# Patient Record
Sex: Female | Born: 1950 | Race: White | Hispanic: No | Smoking: Never smoker
Health system: Southern US, Community
[De-identification: ages and names within clinical notes are randomized; demographics above are authoritative.]

## PROBLEM LIST (undated history)

## (undated) DIAGNOSIS — I639 Cerebral infarction, unspecified: Secondary | ICD-10-CM

## (undated) DIAGNOSIS — F329 Major depressive disorder, single episode, unspecified: Secondary | ICD-10-CM

## (undated) DIAGNOSIS — H269 Unspecified cataract: Secondary | ICD-10-CM

## (undated) DIAGNOSIS — R569 Unspecified convulsions: Secondary | ICD-10-CM

## (undated) DIAGNOSIS — E119 Type 2 diabetes mellitus without complications: Secondary | ICD-10-CM

## (undated) DIAGNOSIS — N390 Urinary tract infection, site not specified: Secondary | ICD-10-CM

## (undated) DIAGNOSIS — F32A Depression, unspecified: Secondary | ICD-10-CM

## (undated) DIAGNOSIS — M1990 Unspecified osteoarthritis, unspecified site: Secondary | ICD-10-CM

## (undated) DIAGNOSIS — F419 Anxiety disorder, unspecified: Secondary | ICD-10-CM

## (undated) HISTORY — PX: CHOLECYSTECTOMY: SHX55

## (undated) HISTORY — PX: APPENDECTOMY: SHX54

## (undated) HISTORY — DX: Unspecified convulsions: R56.9

## (undated) HISTORY — DX: Unspecified osteoarthritis, unspecified site: M19.90

## (undated) HISTORY — DX: Cerebral infarction, unspecified: I63.9

## (undated) HISTORY — DX: Major depressive disorder, single episode, unspecified: F32.9

## (undated) HISTORY — PX: ABDOMINAL HYSTERECTOMY: SHX81

## (undated) HISTORY — DX: Depression, unspecified: F32.A

## (undated) HISTORY — DX: Anxiety disorder, unspecified: F41.9

## (undated) HISTORY — PX: BREAST SURGERY: SHX581

## (undated) HISTORY — DX: Urinary tract infection, site not specified: N39.0

## (undated) HISTORY — PX: EYE SURGERY: SHX253

## (undated) HISTORY — PX: FRACTURE SURGERY: SHX138

## (undated) HISTORY — DX: Unspecified cataract: H26.9

---

## 2005-03-19 HISTORY — PX: MASTECTOMY: SHX3

## 2005-07-26 DIAGNOSIS — I639 Cerebral infarction, unspecified: Secondary | ICD-10-CM | POA: Insufficient documentation

## 2011-07-03 DIAGNOSIS — G40909 Epilepsy, unspecified, not intractable, without status epilepticus: Secondary | ICD-10-CM | POA: Insufficient documentation

## 2012-01-10 ENCOUNTER — Ambulatory Visit (INDEPENDENT_AMBULATORY_CARE_PROVIDER_SITE_OTHER): Payer: Medicare Other | Admitting: Family Medicine

## 2012-01-10 ENCOUNTER — Ambulatory Visit: Payer: Medicare Other

## 2012-01-10 VITALS — BP 116/70 | HR 88 | Temp 98.5°F | Resp 20

## 2012-01-10 DIAGNOSIS — S79929A Unspecified injury of unspecified thigh, initial encounter: Secondary | ICD-10-CM

## 2012-01-10 DIAGNOSIS — M79609 Pain in unspecified limb: Secondary | ICD-10-CM

## 2012-01-10 DIAGNOSIS — S79919A Unspecified injury of unspecified hip, initial encounter: Secondary | ICD-10-CM

## 2012-01-10 DIAGNOSIS — S99929A Unspecified injury of unspecified foot, initial encounter: Secondary | ICD-10-CM

## 2012-01-10 DIAGNOSIS — S92919A Unspecified fracture of unspecified toe(s), initial encounter for closed fracture: Secondary | ICD-10-CM

## 2012-01-10 DIAGNOSIS — S92403A Displaced unspecified fracture of unspecified great toe, initial encounter for closed fracture: Secondary | ICD-10-CM

## 2012-01-10 NOTE — Progress Notes (Signed)
Subjective:    Patient ID: Heather Luna, female    DOB: Jul 18, 1950, 61 y.o.   MRN: [id number] Chief Complaint  Patient presents with  . Foot Injury    L great toe and foot x 3 days d/t fall on Monday, has been in hosp w/head injury, concussion w/bleeding in brain, off of blood thinner    HPI   H/o multiple strokes.  She has been walking "more weak" so told she needed 24hr care.  Then she had a fall on Monday and was  airlifted to [facility] for care (from [place] where she had been living alone).  She was found to have a cerebral hemorrhage and was just released from hosp yest.  Since she can't live alone any longer, she went home to stay w/  [name] who lives in [place].  She was set up for PT so she could start walking again but Shanda Bumps noticed that her mothers Left first toe and foot has become progressively blue in the past day.  No further injuries or falls since her hosp release but in the hosp she was bed bound the whole time so thinks that probably no one noticed the injury.  She wants to make sure her mother doesn't have a broken toe before she goes to her PT appt tomorrow.  She wears a left foot brace due to foot drop from the previous stroke.  Meds ordered this encounter  Medications  . dipyridamole-aspirin (AGGRENOX) 200-25 MG per 12 hr capsule    Sig: Take 1 capsule by mouth 2 (two) times daily.  . citalopram (CELEXA) 20 MG tablet    Sig: Take 20 mg by mouth daily.  Marland Kitchen atorvastatin (LIPITOR) 20 MG tablet    Sig: Take 40 mg by mouth daily.  Marland Kitchen lamoTRIgine (LAMICTAL) 25 MG tablet    Sig: Take 25 mg by mouth 2 (two) times daily.  Marland Kitchen sulfamethoxazole-trimethoprim (BACTRIM DS) 800-160 MG per tablet    Sig: Take 1 tablet by mouth 2 (two) times daily.  Marland Kitchen trimethoprim (TRIMPEX) 100 MG tablet    Sig: Take 100 mg by mouth daily.   No Active Allergies    Review of Systems  Constitutional: Positive for activity change, appetite change and fatigue. Negative for fever and  chills.  Musculoskeletal: Positive for myalgias, back pain, joint swelling, arthralgias and gait problem.  Skin: Positive for color change. Negative for wound.  Neurological: Positive for speech difficulty, weakness, light-headedness, numbness and headaches.  Hematological: Negative for adenopathy. Bruises/bleeds easily.      BP 116/70  Pulse 88  Temp(Src) 98.5 F (36.9 C) (Oral)  Resp 20  SpO2 99% - unable to weigh as in wheelchair. Objective:   Physical Exam  Constitutional: She is oriented to person, place, and time. She appears well-developed and well-nourished. No distress.  Obese, in wheelchair. History all provided by daughter Shanda Bumps  HENT:  Head: Normocephalic and atraumatic.  Right Ear: External ear normal.  Eyes: Conjunctivae are normal. No scleral icterus.  Pulmonary/Chest: Effort normal.  Musculoskeletal:       Left foot: She exhibits decreased range of motion, tenderness, bony tenderness, swelling and laceration. She exhibits normal capillary refill and no deformity.       Feet:  Left first toe at MTP joint very black, blue with ecchymosis, swollen, ttp  Neurological: She is alert and oriented to person, place, and time.  Skin: Skin is warm and dry. Bruising and ecchymosis noted. No abrasion noted. She is not diaphoretic. No  erythema.  Psychiatric: She has a normal mood and affect. Her behavior is normal.       [facility] reading (PRIMARY) by  Dr. Clelia Croft. Severe arthritis change. Non-displaced fracture of first proximal phalanx.  Assessment & Plan:  Toe injury - Plan: DG Toe Great Left  Fracture of great toe - Plan: Ambulatory referral to Orthopedic Surgery - refer pt back to Ortho at [facility] due to recent medical complications and that is where the rest of her specialty care is.  OK to leave on orthotic shoe with brace for foot drop as it is very firm with thick sole but advise non-weight bearing until eval - pt will be in wheelchair and will not start PT until directed by  ortho.  Does not need anything for pain

## 2012-01-14 ENCOUNTER — Telehealth: Payer: Self-pay

## 2012-01-14 NOTE — Telephone Encounter (Signed)
Dr. Kathe Mariner called to request x-ray report for patient's visit 01/10/12.  The patient is in office now being seen and the physician is requesting the document.  Please fax to [phone].

## 2012-01-14 NOTE — Telephone Encounter (Signed)
Radiology report routed to Dr. Kathe Mariner via Epic.

## 2012-02-20 DIAGNOSIS — S92919A Unspecified fracture of unspecified toe(s), initial encounter for closed fracture: Secondary | ICD-10-CM | POA: Insufficient documentation

## 2012-04-23 DIAGNOSIS — R32 Unspecified urinary incontinence: Secondary | ICD-10-CM | POA: Insufficient documentation

## 2013-09-24 DIAGNOSIS — N390 Urinary tract infection, site not specified: Secondary | ICD-10-CM | POA: Insufficient documentation

## 2018-03-10 ENCOUNTER — Encounter (HOSPITAL_COMMUNITY): Payer: Self-pay | Admitting: *Deleted

## 2018-03-10 ENCOUNTER — Emergency Department (HOSPITAL_COMMUNITY)
Admission: EM | Admit: 2018-03-10 | Discharge: 2018-03-10 | Disposition: A | Discharge status: Home / Self Care | Payer: Medicare HMO | Attending: Emergency Medicine | Admitting: Emergency Medicine

## 2018-03-10 ENCOUNTER — Emergency Department (HOSPITAL_COMMUNITY): Payer: Medicare HMO

## 2018-03-10 DIAGNOSIS — W010XXA Fall on same level from slipping, tripping and stumbling without subsequent striking against object, initial encounter: Secondary | ICD-10-CM | POA: Insufficient documentation

## 2018-03-10 DIAGNOSIS — S9031XA Contusion of right foot, initial encounter: Secondary | ICD-10-CM

## 2018-03-10 DIAGNOSIS — Z79899 Other long term (current) drug therapy: Secondary | ICD-10-CM | POA: Insufficient documentation

## 2018-03-10 DIAGNOSIS — Y929 Unspecified place or not applicable: Secondary | ICD-10-CM | POA: Diagnosis not present

## 2018-03-10 DIAGNOSIS — S99922A Unspecified injury of left foot, initial encounter: Secondary | ICD-10-CM | POA: Diagnosis present

## 2018-03-10 DIAGNOSIS — S9032XA Contusion of left foot, initial encounter: Secondary | ICD-10-CM | POA: Insufficient documentation

## 2018-03-10 DIAGNOSIS — Y939 Activity, unspecified: Secondary | ICD-10-CM | POA: Diagnosis not present

## 2018-03-10 DIAGNOSIS — Y999 Unspecified external cause status: Secondary | ICD-10-CM | POA: Insufficient documentation

## 2018-03-10 MED ORDER — ACETAMINOPHEN 325 MG PO TABS
650.0000 mg | ORAL_TABLET | Freq: Once | ORAL | Status: AC
  Administered 2018-03-10: 650 mg via ORAL
  Filled 2018-03-10: qty 2

## 2018-03-10 NOTE — ED Notes (Signed)
Spoke with Clydie BraunKaren, NP about patient speaking with social Investment banker, operationalworker/case manager. Clydie BraunKaren, NP reports Marylene Landngela, [facility] is coming to evaluate the situation. Also, [facility] for ortho tech to place on patient.

## 2018-03-10 NOTE — ED Triage Notes (Signed)
Per EMS, pt fell walking out of bathroom, hurting left foot. Pt has pain and swelling to left foot, cannot bear weight on left foot.   BP 122/70 HR 82 O2 90%

## 2018-03-10 NOTE — Care Management Note (Signed)
Case Management Note  Patient Details  Name: Heather NearingCatherine Lancon MRN: [id number] Date of Birth: Sep 06, 1950  CM consulted for transition of care needs.  CM spoke with pt and daughter at bedside at length about needs.  Daughter requested a 3-in-1, both report that a wheelchair would not work in her apartment and that she also can only use one arm to maneuver the DME anyway.  Discussed HH.  [facility]  Discussed LTC plans and assets as well.  CM made suggestions to start thinking and planning for things in the future.  CM contacted [name] with [facility] who accepted pt for services and is aware of needs.  They will contact the pt's daughter as the pt is likely going home with her today.  Updated Keenan BachelorSofia, PA and HaenaMatt, Charity fundraiserN.  No further CM needs noted at this time.  Expected Discharge Date:   03/10/2018               Expected Discharge Plan:  Home w Home Health Services  In-House Referral:  Clinical Social Work  Discharge planning Services  CM Consult  Post Acute Care Choice:  Durable Medical Equipment, Home Health Choice offered to:  Patient, Adult Children  DME Arranged:  3-N-1 DME Agency:  [facility]  HH Arranged:  RN, PT, OT, Nurse's Aide, Social Work [facility] Agency:   [facility]  Status of Service:  Completed, signed off  Omarr Hann, Lynnae Sandhoffngela N, RN 03/10/2018, 3:42 PM

## 2018-03-10 NOTE — Discharge Instructions (Signed)
Follow up with your Physician for recheck if pain persist.  

## 2018-03-10 NOTE — ED Provider Notes (Signed)
[facility]-EMERGENCY DEPT Provider Note   CSN: [id number] Arrival date & time: 03/10/18  1027     History   Chief Complaint Chief Complaint  Patient presents with  . Foot Pain    left    HPI Heather Luna is a 67 y.o. female.  The history is provided by the patient and a relative. No language interpreter was used.  Foot Pain  This is a new problem. The current episode started 3 to 5 hours ago. The problem occurs constantly. The problem has not changed since onset.Nothing aggravates the symptoms. Nothing relieves the symptoms. She has tried nothing for the symptoms. The treatment provided no relief.  Pt complains of pain in her left foot.  Pt reports she tripped and fell.  Pt reports she did not have her brace on.  Pt has had a stroke and can not walk without brace.   Past Medical History:  Diagnosis Date  . Anxiety   . Arthritis   . Cataract   . Chronic UTI   . Depression   . Seizures (HCC)   . Stroke [facility](HCC)     There are no active problems to display for this patient.   Past Surgical History:  Procedure Laterality Date  . ABDOMINAL HYSTERECTOMY    . APPENDECTOMY    . BREAST SURGERY     left breast  . CHOLECYSTECTOMY    . EYE SURGERY     left cataract  . FRACTURE SURGERY     R foot     OB History   No obstetric history on file.      Home Medications    Prior to Admission medications   Medication Sig Start Date End Date Taking? Authorizing Provider  atorvastatin (LIPITOR) 20 MG tablet Take 40 mg by mouth daily.    [provider]  citalopram (CELEXA) 20 MG tablet Take 20 mg by mouth daily.    [provider]  dipyridamole-aspirin (AGGRENOX) 200-25 MG per 12 hr capsule Take 1 capsule by mouth 2 (two) times daily.    [provider]  lamoTRIgine (LAMICTAL) 25 MG tablet Take 25 mg by mouth 2 (two) times daily.    [provider]  sulfamethoxazole-trimethoprim (BACTRIM DS) 800-160 MG per tablet  Take 1 tablet by mouth 2 (two) times daily.    [provider]  trimethoprim (TRIMPEX) 100 MG tablet Take 100 mg by mouth daily.    [provider]    Family History No family history on file.  Social History Social History   Tobacco Use  . Smoking status: Never Smoker  Substance Use Topics  . Alcohol use: Not on file  . Drug use: Not on file     Allergies   Patient has no active allergies.   Review of Systems Review of Systems  All other systems reviewed and are negative.    Physical Exam Updated Vital Signs BP 109/63 (BP Location: Right Arm)   Pulse 81   Temp 98 F (36.7 C)   Resp 18   SpO2 96%   Physical Exam Vitals signs reviewed.  Constitutional:      Appearance: Normal appearance.  HENT:     Head: Normocephalic.     Nose: Nose normal.  Eyes:     Pupils: Pupils are equal, round, and reactive to light.  Cardiovascular:     Rate and Rhythm: Normal rate.     Pulses: Normal pulses.  Pulmonary:     Effort: Pulmonary effort  is normal.  Musculoskeletal:        General: Swelling, tenderness and signs of injury present.     Comments: Swollen tender let foot, bruised tender,  nv and ns intact,    Skin:    General: Skin is warm.  Neurological:     Mental Status: She is alert. Mental status is at baseline.  Psychiatric:        Mood and Affect: Mood normal.      ED Treatments / Results  Labs (all labs ordered are listed, but only abnormal results are displayed) Labs Reviewed - No data to display  EKG None  Radiology Dg Foot Complete Left  Result Date: 03/10/2018 CLINICAL DATA:  LEFT foot pain and swelling, muscle spasms EXAM: LEFT FOOT - COMPLETE 3+ VIEW COMPARISON:  LEFT toe radiographs 01/10/2012 FINDINGS: Osseous demineralization. Joint space narrowing and minimal spur formation at first MTP joint. Additional degenerative changes at first TMT joint. Small plantar calcaneal spur. Diffuse soft tissue swelling LEFT foot. Pronounced  arch. No acute fracture, dislocation or bone destruction. IMPRESSION: Osseous demineralization with scattered degenerative changes and calcaneal spurring. No acute abnormalities. Electronically Signed   By: Ulyses SouthwardMark  Boles M.D.   On: 03/10/2018 12:43    Procedures Procedures (including critical care time)  Medications Ordered in ED Medications  acetaminophen (TYLENOL) tablet 650 mg (650 mg Oral Given 03/10/18 1135)     Initial Impression / Assessment and Plan / ED Course  I have reviewed the triage vital signs and the nursing notes.  Pertinent labs & imaging results that were available during my care of the patient were reviewed by me and considered in my medical decision making (see chart for details).     Xrays no fracture. [facility] will try to fit pt in a brace as her brace will not fit.   Care management will consult for home heath services  Final Clinical Impressions(s) / ED Diagnoses   Final diagnoses:  Contusion of right foot, initial encounter    ED Discharge Orders    None    An After Visit Summary was printed and given to the patient.    Elson AreasSofia, Zori Benbrook K, [place] JerseyPA-C 03/10/18 1438    Arby BarrettePfeiffer, Marcy, MD 03/10/18 [phone]

## 2018-03-10 NOTE — Progress Notes (Signed)
CSW [facility] CSW informed that pt is in need of further equipment at home. CSW asked that Clydie BraunKaren [facility].  CSW signing off at this time.    Heather MangesKierra S. Eryn Luna, MSW, LCSW-A Emergency Department Clinical Social Worker [phone]

## 2018-03-10 NOTE — ED Notes (Addendum)
Pt requests bed side commode for home use. Case management notified. Bedside commode is to be delivered before pt leaves.

## 2018-04-07 ENCOUNTER — Other Ambulatory Visit: Payer: Self-pay | Admitting: Internal Medicine

## 2018-04-07 DIAGNOSIS — Z1231 Encounter for screening mammogram for malignant neoplasm of breast: Secondary | ICD-10-CM

## 2018-08-13 ENCOUNTER — Encounter (HOSPITAL_COMMUNITY): Payer: Self-pay

## 2018-08-13 ENCOUNTER — Emergency Department (HOSPITAL_COMMUNITY)
Admission: EM | Admit: 2018-08-13 | Discharge: 2018-08-13 | Disposition: A | Discharge status: Home / Self Care | Payer: Medicare HMO | Attending: Emergency Medicine | Admitting: Emergency Medicine

## 2018-08-13 ENCOUNTER — Emergency Department (HOSPITAL_COMMUNITY): Payer: Medicare HMO

## 2018-08-13 ENCOUNTER — Other Ambulatory Visit: Payer: Self-pay

## 2018-08-13 DIAGNOSIS — Z79899 Other long term (current) drug therapy: Secondary | ICD-10-CM | POA: Diagnosis not present

## 2018-08-13 DIAGNOSIS — Z23 Encounter for immunization: Secondary | ICD-10-CM | POA: Insufficient documentation

## 2018-08-13 DIAGNOSIS — Y929 Unspecified place or not applicable: Secondary | ICD-10-CM | POA: Insufficient documentation

## 2018-08-13 DIAGNOSIS — Y939 Activity, unspecified: Secondary | ICD-10-CM | POA: Diagnosis not present

## 2018-08-13 DIAGNOSIS — Z8673 Personal history of transient ischemic attack (TIA), and cerebral infarction without residual deficits: Secondary | ICD-10-CM | POA: Diagnosis not present

## 2018-08-13 DIAGNOSIS — S0101XA Laceration without foreign body of scalp, initial encounter: Secondary | ICD-10-CM | POA: Diagnosis not present

## 2018-08-13 DIAGNOSIS — Y999 Unspecified external cause status: Secondary | ICD-10-CM | POA: Diagnosis not present

## 2018-08-13 DIAGNOSIS — W01198A Fall on same level from slipping, tripping and stumbling with subsequent striking against other object, initial encounter: Secondary | ICD-10-CM | POA: Insufficient documentation

## 2018-08-13 DIAGNOSIS — S0990XA Unspecified injury of head, initial encounter: Secondary | ICD-10-CM | POA: Diagnosis present

## 2018-08-13 MED ORDER — TETANUS-DIPHTH-ACELL PERTUSSIS 5-2.5-18.5 LF-MCG/0.5 IM SUSP
0.5000 mL | Freq: Once | INTRAMUSCULAR | Status: AC
  Administered 2018-08-13: 0.5 mL via INTRAMUSCULAR
  Filled 2018-08-13: qty 0.5

## 2018-08-13 MED ORDER — BACITRACIN ZINC 500 UNIT/GM EX OINT
TOPICAL_OINTMENT | CUTANEOUS | Status: AC
  Administered 2018-08-13: 20:00:00
  Filled 2018-08-13: qty 0.9

## 2018-08-13 NOTE — ED Triage Notes (Signed)
Per EMS- Patient tripped while she was walking into her door way and fell backwards, hitting her head. Patient has a laceration to the back of her head. Bleeding controlled. No LOC., No pain. No N/V, blurred vision.

## 2018-08-13 NOTE — ED Notes (Signed)
Patient transported to CT 

## 2018-08-13 NOTE — ED Provider Notes (Signed)
[facility]EMERGENCY DEPT Provider Note   CSN: [id number] Arrival date & time: 08/13/18  1731    History   Chief Complaint Chief Complaint  Patient presents with   Fall   Head Injury    HPI Heather Luna is a 68 y.o. female.     HPI Patient with history of stroke and residual left-sided weakness states she lost her balance and fell forward hitting her head on the windowsill.  No loss of consciousness.  Sustained laceration to the left posterior scalp.  Unknown last tetanus.  Patient is on Aggrenox daily. Past Medical History:  Diagnosis Date   Anxiety    Arthritis    Cataract    Chronic UTI    Depression    Seizures (HCC)    Stroke (HCC)     There are no active problems to display for this patient.   Past Surgical History:  Procedure Laterality Date   ABDOMINAL HYSTERECTOMY     APPENDECTOMY     BREAST SURGERY     left breast   CHOLECYSTECTOMY     EYE SURGERY     left cataract   FRACTURE SURGERY     R foot     OB History   No obstetric history on file.      Home Medications    Prior to Admission medications   Medication Sig Start Date End Date Taking? Authorizing Provider  atorvastatin (LIPITOR) 20 MG tablet Take 20 mg by mouth daily.     [provider]  atorvastatin (LIPITOR) 40 MG tablet Take 40 mg by mouth daily. 07/17/18   [provider]  b complex vitamins tablet Take 1 tablet by mouth daily. 07/05/11   [provider]  citalopram (CELEXA) 20 MG tablet Take 20 mg by mouth daily.    [provider]  CRANBERRY PO Take 1 tablet by mouth daily.    [provider]  dipyridamole-aspirin (AGGRENOX) 200-25 MG per 12 hr capsule Take 1 capsule by mouth 2 (two) times daily.    [provider]  lamoTRIgine (LAMICTAL) 25 MG tablet Take 25 mg by mouth 2 (two) times daily.    [provider]  mirtazapine (REMERON) 15 MG tablet Take 7.5 mg by mouth at bedtime.  01/09/18   [provider]  oxybutynin (DITROPAN) 5 MG tablet Take 5 mg by mouth 2 (two) times daily. 02/28/18   [provider]    Family History Family History  Problem Relation Age of Onset   Rheum arthritis Mother    Cancer Mother    Heart failure Father     Social History Social History   Tobacco Use   Smoking status: Never Smoker   Smokeless tobacco: Never Used  Substance Use Topics   Alcohol use: Never    Frequency: Never   Drug use: Never     Allergies   Septra [sulfamethoxazole-trimethoprim] and Levetiracetam   Review of Systems Review of Systems  Constitutional: Negative for chills and fever.  Eyes: Negative for visual disturbance.  Respiratory: Negative for shortness of breath.   Cardiovascular: Negative for chest pain.  Gastrointestinal: Negative for abdominal pain, nausea and vomiting.  Musculoskeletal: Negative for neck pain.  Skin: Positive for wound.  Neurological: Negative for syncope, weakness, numbness and headaches.  All other systems reviewed and are negative.    Physical Exam Updated Vital Signs BP 109/69 (BP Location: Right Arm)    Pulse 92    Temp 98.4 F (36.9 C) (Oral)  Resp 16    Ht 5\' 2"  (1.575 m)    Wt 95.3 kg    SpO2 100%    BMI 38.41 kg/m   Physical Exam Vitals signs and nursing note reviewed.  Constitutional:      Appearance: Normal appearance. She is well-developed.  HENT:     Head: Normocephalic.     Comments: Hair matted with blood in the left posterior scalp.  No active bleeding noted.  Midface is stable.  No intraoral trauma.    Nose: Nose normal.     Mouth/Throat:     Mouth: Mucous membranes are moist.  Eyes:     Pupils: Pupils are equal, round, and reactive to light.  Neck:     Musculoskeletal: Normal range of motion and neck supple. No neck rigidity or muscular tenderness.     Comments: No posterior midline cervical tenderness to palpation. Cardiovascular:     Rate and Rhythm: Normal  rate and regular rhythm.     Heart sounds: No murmur. No friction rub. No gallop.   Pulmonary:     Effort: Pulmonary effort is normal.     Breath sounds: Normal breath sounds.  Abdominal:     General: Bowel sounds are normal.     Palpations: Abdomen is soft.     Tenderness: There is no abdominal tenderness. There is no guarding or rebound.  Musculoskeletal: Normal range of motion.        General: No tenderness.  Lymphadenopathy:     Cervical: No cervical adenopathy.  Skin:    General: Skin is warm and dry.     Findings: No erythema or rash.  Neurological:     Mental Status: She is alert and oriented to person, place, and time.     Comments: Left upper extremity is contracted and weak.  His baseline per patient.  4/5 motor in left lower extremity.  5/5 motor and left upper right upper and right lower extremities.  Psychiatric:        Behavior: Behavior normal.      ED Treatments / Results  Labs (all labs ordered are listed, but only abnormal results are displayed) Labs Reviewed - No data to display  EKG None  Radiology Ct Head Wo Contrast  Result Date: 08/13/2018 CLINICAL DATA:  Pain status post fall EXAM: CT HEAD WITHOUT CONTRAST TECHNIQUE: Contiguous axial images were obtained from the base of the skull through the vertex without intravenous contrast. COMPARISON:  None. FINDINGS: Brain: No evidence of acute infarction, hemorrhage, hydrocephalus, extra-axial collection or mass lesion/mass effect. There is a large area of encephalomalacia involving the right frontal lobe. There is likely an old infarct involving the left cerebellum. Vascular: No hyperdense vessel or unexpected calcification. Skull: Normal. Negative for fracture or focal lesion. There is left scalp soft tissue swelling with probable underlying laceration. No evidence of a radiopaque foreign body. Sinuses/Orbits: No acute finding. The patient is status post bilateral cataract surgery. Other: None. IMPRESSION: 1. No  acute intracranial abnormality detected. 2. Left posterior scalp swelling without evidence of a calvarial defect. 3. Large old right frontal lobe infarct. Probable old infarct involving the left cerebellum. Electronically Signed   By: Katherine Mantle M.D.   On: 08/13/2018 18:39    Procedures Procedures (including critical care time)  Medications Ordered in ED Medications  Tdap (BOOSTRIX) injection 0.5 mL (has no administration in time range)     Initial Impression / Assessment and Plan / ED Course  I have reviewed the triage vital  signs and the nursing notes.  Pertinent labs & imaging results that were available during my care of the patient were reviewed by me and considered in my medical decision making (see chart for details).        Examine patient's scalp after irrigation by nursing staff.  Patient does have small wound in the left posterior occipital scalp.  Small hematoma.  No active bleeding.  Does not feel that surgical closure is needed at this time.  Antibiotic ointment placed on the wound.  Head injury precautions given.  Final Clinical Impressions(s) / ED Diagnoses   Final diagnoses:  Scalp injury, initial encounter    ED Discharge Orders    None       Loren RacerYelverton, Braxen Dobek, MD 08/13/18 [phone]

## 2018-08-13 NOTE — ED Notes (Signed)
Pt and daughter verbalized discharge instructions and follow up care. Alert and assisted into vehicle via wheelchair. Wound dressed.

## 2019-11-06 ENCOUNTER — Other Ambulatory Visit: Payer: Self-pay | Admitting: Internal Medicine

## 2020-01-27 ENCOUNTER — Other Ambulatory Visit: Payer: Self-pay | Admitting: Internal Medicine

## 2020-01-27 DIAGNOSIS — R5381 Other malaise: Secondary | ICD-10-CM

## 2020-06-24 ENCOUNTER — Emergency Department (HOSPITAL_COMMUNITY)
Admission: EM | Admit: 2020-06-24 | Discharge: 2020-06-24 | Disposition: A | Discharge status: Home / Self Care | Payer: Medicare HMO | Attending: Emergency Medicine | Admitting: Emergency Medicine

## 2020-06-24 ENCOUNTER — Other Ambulatory Visit: Payer: Self-pay

## 2020-06-24 ENCOUNTER — Emergency Department (HOSPITAL_COMMUNITY): Payer: Medicare HMO

## 2020-06-24 ENCOUNTER — Encounter (HOSPITAL_COMMUNITY): Payer: Self-pay

## 2020-06-24 DIAGNOSIS — M79675 Pain in left toe(s): Secondary | ICD-10-CM | POA: Insufficient documentation

## 2020-06-24 DIAGNOSIS — Y9301 Activity, walking, marching and hiking: Secondary | ICD-10-CM | POA: Diagnosis not present

## 2020-06-24 DIAGNOSIS — S0003XA Contusion of scalp, initial encounter: Secondary | ICD-10-CM | POA: Insufficient documentation

## 2020-06-24 DIAGNOSIS — Z7984 Long term (current) use of oral hypoglycemic drugs: Secondary | ICD-10-CM | POA: Diagnosis not present

## 2020-06-24 DIAGNOSIS — S6991XA Unspecified injury of right wrist, hand and finger(s), initial encounter: Secondary | ICD-10-CM | POA: Diagnosis present

## 2020-06-24 DIAGNOSIS — W01198A Fall on same level from slipping, tripping and stumbling with subsequent striking against other object, initial encounter: Secondary | ICD-10-CM | POA: Insufficient documentation

## 2020-06-24 DIAGNOSIS — S62524A Nondisplaced fracture of distal phalanx of right thumb, initial encounter for closed fracture: Secondary | ICD-10-CM | POA: Insufficient documentation

## 2020-06-24 DIAGNOSIS — W19XXXA Unspecified fall, initial encounter: Secondary | ICD-10-CM

## 2020-06-24 DIAGNOSIS — E119 Type 2 diabetes mellitus without complications: Secondary | ICD-10-CM | POA: Diagnosis not present

## 2020-06-24 HISTORY — DX: Type 2 diabetes mellitus without complications: E11.9

## 2020-06-24 MED ORDER — ACETAMINOPHEN 500 MG PO TABS
1000.0000 mg | ORAL_TABLET | Freq: Once | ORAL | Status: AC
  Administered 2020-06-24: 1000 mg via ORAL
  Filled 2020-06-24: qty 2

## 2020-06-24 NOTE — ED Triage Notes (Signed)
Pt comes in from home after a fall about 8 hours ago. States she tripped over her cat. She reports left lower leg pain, right thumb pain, and right sided head pain.

## 2020-06-24 NOTE — Discharge Instructions (Addendum)
You were seen in the emergency department after a fall.  We did x-rays of your left toe, right thumb, left ankle.  We did a CT of your head.  You have a small stable fracture of the right thumb at the knuckle.  Please wear your splint throughout the day and during sleep.  The splint keeps your joint in a stable position to optimize healing.  You may remove the splint to bathe, wash your hands.  Elevate your hand.  Apply ice.  You can take Tylenol for pain.  Please be cautious when walking around her home with a cane.  Your right thumb fracture puts you at risk for falling.  Follow-up with primary care doctor in 1 week if pain has not improved  Return to the ED for worsening or new symptoms

## 2020-06-24 NOTE — ED Provider Notes (Signed)
[facility]-EMERGENCY DEPT Provider Note   CSN: [id number] Arrival date & time: 06/24/20  7741     History Chief Complaint  Patient presents with  . Fall    Heather Luna is a 70 y.o. female presents to the ED for evaluation of fall that happened around midnight last night.  Patient was walking and tripped over her cat.  She thinks she was probably going to the bathroom.  She fell forward and landed on the right side of her body.  She struck the right side of her forehead on a nearby couch.  She was able to get up and walk and called her daughter.  When her daughter got to her house this morning she noticed that patient had a bruise on her forehead and had pain in her left great toe and her right thumb is bruised.  They are concerned about fractures.  Patient has history of several strokes with left-sided weakness.  She has left foot drop and states that she was not using her brace when she was walking in the middle of the night.  Denies any loss of consciousness.  No visual changes, nausea or vomiting.  No neck pain.  No back pain.  Denies any other physical injuries.  She is on Aggrenox but other coagulants.  Has been feeling well over the last few days.  Denies prodromal lightheadedness, chest pain or shortness of breath.  HPI     Past Medical History:  Diagnosis Date  . Anxiety   . Arthritis   . Cataract   . Chronic UTI   . Depression   . Diabetes mellitus without complication (HCC)   . Seizures (HCC)   . Stroke [facility])     There are no problems to display for this patient.   Past Surgical History:  Procedure Laterality Date  . ABDOMINAL HYSTERECTOMY    . APPENDECTOMY    . BREAST SURGERY     left breast  . CHOLECYSTECTOMY    . EYE SURGERY     left cataract  . FRACTURE SURGERY     R foot     OB History   No obstetric history on file.     Family History  Problem Relation Age of Onset  . Rheum arthritis Mother   . Cancer Mother   . Heart  failure Father     Social History   Tobacco Use  . Smoking status: Never Smoker  . Smokeless tobacco: Never Used  Vaping Use  . Vaping Use: Never used  Substance Use Topics  . Alcohol use: Never  . Drug use: Never    Home Medications Prior to Admission medications   Medication Sig Start Date End Date Taking? Authorizing Provider  acetaminophen (TYLENOL) 500 MG tablet Take 500 mg by mouth every 6 (six) hours as needed for mild pain.   Yes [provider]  atorvastatin (LIPITOR) 40 MG tablet Take 40 mg by mouth at bedtime. 07/17/18  Yes [provider]  b complex vitamins tablet Take 1 tablet by mouth daily. 07/05/11  Yes [provider]  citalopram (CELEXA) 20 MG tablet Take 20 mg by mouth daily.   Yes [provider]  CRANBERRY PO Take 1 tablet by mouth daily.   Yes [provider]  dipyridamole-aspirin (AGGRENOX) 200-25 MG per 12 hr capsule Take 1 capsule by mouth 2 (two) times daily.   Yes [provider]  lamoTRIgine (LAMICTAL) 25 MG tablet Take 75 mg by mouth 2 (  two) times daily.   Yes [provider]  metFORMIN (GLUCOPHAGE) 500 MG tablet Take 500 mg by mouth 2 (two) times daily. 03/31/20  Yes [provider]  mirtazapine (REMERON) 15 MG tablet Take 7.5 mg by mouth at bedtime. 01/09/18  Yes [provider]  oxybutynin (DITROPAN) 5 MG tablet Take 5 mg by mouth 2 (two) times daily. 02/28/18  Yes [provider]    Allergies    Septra [sulfamethoxazole-trimethoprim] and Levetiracetam  Review of Systems   Review of Systems  Musculoskeletal: Positive for arthralgias.  Skin: Positive for color change.  All other systems reviewed and are negative.   Physical Exam Updated Vital Signs BP 115/66   Pulse 66   Temp 98.2 F (36.8 C) (Oral)   Resp 18   Ht 5\' 2"  (1.575 m)   Wt 85.7 kg   SpO2 94%   BMI 34.57 kg/m   Physical Exam Vitals and nursing note reviewed.  Constitutional:       General: She is not in acute distress.    Appearance: She is well-developed.     Comments: NAD.  HENT:     Head: Normocephalic.     Comments: Right frontal contusion/ecchymosis. No scalp bone crepitus or other signs of injury     Right Ear: External ear normal.     Left Ear: External ear normal.     Nose: Nose normal.  Eyes:     General: No scleral icterus.    Conjunctiva/sclera: Conjunctivae normal.  Neck:     Comments: No midline or paraspinal cervical tenderness  Cardiovascular:     Rate and Rhythm: Normal rate and regular rhythm.     Heart sounds: Normal heart sounds.  Pulmonary:     Effort: Pulmonary effort is normal.     Breath sounds: Normal breath sounds.  Abdominal:     Palpations: Abdomen is soft.     Tenderness: There is no abdominal tenderness.  Musculoskeletal:        General: Normal range of motion.     Cervical back: Normal range of motion and neck supple.     Comments: Focal tenderness and ecchymosis, mild edema diffusely or right thumb. Max point of tenderness at IP joint, distal phalanx. Nail intact. Able to flex/extend finger with some pain. No focal scaphoid or wrist tenderness.  Focal tenderness at IP joint and proximal/distal phalanx. No edema, ecchymosis.  No tenderness over MTP. Nail is attached, thickened and yellow.   Skin:    General: Skin is warm and dry.     Capillary Refill: Capillary refill takes less than 2 seconds.  Neurological:     Mental Status: She is alert and oriented to person, place, and time.     Comments: Left upper arm weakness. Left leg weakness. Left foot drop, in brace.   Psychiatric:        Behavior: Behavior normal.        Thought Content: Thought content normal.        Judgment: Judgment normal.     ED Results / Procedures / Treatments   Labs (all labs ordered are listed, but only abnormal results are displayed) Labs Reviewed - No data to display  EKG None  Radiology DG Ankle Complete Left  Result Date:  06/24/2020 CLINICAL DATA:  Left ankle pain after fall. EXAM: LEFT ANKLE COMPLETE - 3+ VIEW COMPARISON:  None. FINDINGS: There is no evidence of fracture, dislocation, or joint effusion. There is no evidence of arthropathy or other  focal bone abnormality. Soft tissues are unremarkable. IMPRESSION: Negative. Electronically Signed   By: Lupita Raider M.D.   On: 06/24/2020 11:55   CT Head Wo Contrast  Result Date: 06/24/2020 CLINICAL DATA:  Unwitnessed fall. EXAM: CT HEAD WITHOUT CONTRAST TECHNIQUE: Contiguous axial images were obtained from the base of the skull through the vertex without intravenous contrast. COMPARISON:  June 13, 2018. FINDINGS: Brain: No evidence of acute large vascular territory infarction, acute hemorrhage, hydrocephalus, extra-axial collection or mass lesion/mass effect. Similar large area of encephalomalacia in the right frontal lobe. Similar remote left cerebellar lacunar infarct. Extensive streak artifact at the skull base limits evaluation in this region. Vascular: Calcific atherosclerosis. No hyperdense vessel identified. Skull: Right frontal scalp contusion. No acute fracture. Hyperostosis frontalis. Sinuses/Orbits: Visualized sinuses are clear. Unremarkable visualized orbits. Other: No mastoid effusions. IMPRESSION: 1. No evidence of acute intracranial abnormality. 2. Right frontal scalp contusion without acute fracture. 3. Similar large area of encephalomalacia in the right frontal lobe and remote left cerebellar lacunar infarct. Electronically Signed   By: Feliberto Harts MD   On: 06/24/2020 10:42   DG Finger Thumb Right  Result Date: 06/24/2020 CLINICAL DATA:  Pain following fall EXAM: RIGHT THUMB 2+V COMPARISON:  None. FINDINGS: There is a fracture along the lateral proximal most aspect of the first distal phalanx with alignment anatomic. No other fracture. No dislocation. There is mild narrowing of the first MCP and IP joints. There is also mild narrowing of the first  carpal-metacarpal joint and scaphotrapezial joint. No erosive change. IMPRESSION: Nondisplaced fracture along the lateral proximal aspect of the first distal phalanx. No dislocation. Joint space narrowing at several levels. Electronically Signed   By: Bretta Bang III M.D.   On: 06/24/2020 09:55   DG Toe Great Left  Result Date: 06/24/2020 CLINICAL DATA:  Pain following fall EXAM: LEFT FIRST TOE: 3 V COMPARISON:  Left foot radiographs March 10, 2018 FINDINGS: Frontal, oblique, and lateral views were obtained. Bones are osteoporotic. No appreciable acute fracture or dislocation. There is moderate joint space narrowing at all levels. There are flexion deformities second, third, fourth, and fifth PIP joints. IMPRESSION: Osteoporosis. Multilevel osteoarthritic change. No fracture or dislocation. Electronically Signed   By: Bretta Bang III M.D.   On: 06/24/2020 09:57    Procedures Procedures   Medications Ordered in ED Medications  acetaminophen (TYLENOL) tablet 1,000 mg (1,000 mg Oral Given 06/24/20 0913)    ED Course  I have reviewed the triage vital signs and the nursing notes.  Pertinent labs & imaging results that were available during my care of the patient were reviewed by me and considered in my medical decision making (see chart for details).  Clinical Course as of 06/24/20 1204  Fri Jun 24, 2020  1012 DG Finger Thumb Right  IMPRESSION: Nondisplaced fracture along the lateral proximal aspect of the first distal phalanx. No dislocation. Joint space narrowing at several levels. [CG]  1117 Reevaluated patient.  Discussed imaging results.  Now reporting ankle pain that she did not have before.  Will order an x-ray. [CG]    Clinical Course User Index [CG] Jerrell Mylar   MDM Rules/Calculators/A&P                          70 year old female with pertinent history of left-sided weakness from strokes presents to the ED for mechanical fall.  She reports right head  contusion, right thumb pain and bruising and  left toe and ankle pain.  No red flags like prodromal symptoms like chest pain, shortness of breath, lightheadedness, syncope.  No headache.  Reports no other physical injuries from the fall.  She usually uses a cane to ambulate.  EMR, triage nursing notes reviewed  Imaging ordered as above.  Imaging personally visualized and interpreted.  Imaging reveals-nondisplaced fracture along the proximal aspect of the first distal phalanx of the thumb.  X-ray of the ankle, foot and CT of the head unremarkable otherwise.  Medicines given in the ED-Tylenol  Patient reevaluated.  Discussed imaging results.  Will discharge with finger splint.  Thumb fracture is on her right hand that she uses to walk with a cane.  This increases her risk of falls.  Discussed with patient and daughter, they are comfortable being discharged and daughter will help at home. Final Clinical Impression(s) / ED Diagnoses Final diagnoses:  Fall, initial encounter  Closed nondisplaced fracture of distal phalanx of right thumb, initial encounter  Contusion of scalp, initial encounter    Rx / DC Orders ED Discharge Orders    None       Liberty HandyGibbons, Daemyn Gariepy J, PA-C 06/24/20 1204    Pricilla LovelessGoldston, Scott, MD 06/24/20 [phone]

## 2020-06-24 NOTE — Progress Notes (Signed)
Orthopedic Tech Progress Note Patient Details:  Heather Luna 06-Jan-1951 [id number]  Patient ID: Heather Luna, female   DOB: 1950/08/07, 70 y.o.   MRN: [id number]   Heather Luna 06/24/2020, 12:12 PM Right thumb splinted.

## 2020-09-15 ENCOUNTER — Other Ambulatory Visit: Payer: Self-pay | Admitting: Internal Medicine

## 2020-09-15 DIAGNOSIS — Z1231 Encounter for screening mammogram for malignant neoplasm of breast: Secondary | ICD-10-CM

## 2021-01-11 ENCOUNTER — Ambulatory Visit
Admission: RE | Admit: 2021-01-11 | Discharge: 2021-01-11 | Disposition: A | Discharge status: Home / Self Care | Payer: Medicare HMO | Source: Ambulatory Visit | Attending: Internal Medicine | Admitting: Internal Medicine

## 2021-01-11 ENCOUNTER — Other Ambulatory Visit: Payer: Self-pay

## 2021-01-11 DIAGNOSIS — Z1231 Encounter for screening mammogram for malignant neoplasm of breast: Secondary | ICD-10-CM

## 2021-03-30 ENCOUNTER — Other Ambulatory Visit: Payer: Self-pay | Admitting: Internal Medicine

## 2021-03-30 DIAGNOSIS — Z1239 Encounter for other screening for malignant neoplasm of breast: Secondary | ICD-10-CM

## 2021-05-25 ENCOUNTER — Other Ambulatory Visit: Payer: Self-pay | Admitting: Nurse Practitioner

## 2021-05-25 DIAGNOSIS — Z13820 Encounter for screening for osteoporosis: Secondary | ICD-10-CM

## 2021-08-19 ENCOUNTER — Other Ambulatory Visit: Payer: Self-pay

## 2021-08-19 ENCOUNTER — Emergency Department (HOSPITAL_COMMUNITY): Payer: Medicare HMO

## 2021-08-19 ENCOUNTER — Emergency Department (HOSPITAL_COMMUNITY)
Admission: EM | Admit: 2021-08-19 | Discharge: 2021-08-19 | Disposition: A | Discharge status: Home / Self Care | Payer: Medicare HMO | Attending: Emergency Medicine | Admitting: Emergency Medicine

## 2021-08-19 DIAGNOSIS — R109 Unspecified abdominal pain: Secondary | ICD-10-CM | POA: Insufficient documentation

## 2021-08-19 DIAGNOSIS — W01198A Fall on same level from slipping, tripping and stumbling with subsequent striking against other object, initial encounter: Secondary | ICD-10-CM | POA: Insufficient documentation

## 2021-08-19 DIAGNOSIS — S161XXA Strain of muscle, fascia and tendon at neck level, initial encounter: Secondary | ICD-10-CM | POA: Insufficient documentation

## 2021-08-19 DIAGNOSIS — Y92002 Bathroom of unspecified non-institutional (private) residence single-family (private) house as the place of occurrence of the external cause: Secondary | ICD-10-CM | POA: Diagnosis not present

## 2021-08-19 DIAGNOSIS — S060X0A Concussion without loss of consciousness, initial encounter: Secondary | ICD-10-CM | POA: Insufficient documentation

## 2021-08-19 DIAGNOSIS — Z23 Encounter for immunization: Secondary | ICD-10-CM | POA: Insufficient documentation

## 2021-08-19 DIAGNOSIS — S0101XA Laceration without foreign body of scalp, initial encounter: Secondary | ICD-10-CM | POA: Diagnosis not present

## 2021-08-19 DIAGNOSIS — S0990XA Unspecified injury of head, initial encounter: Secondary | ICD-10-CM | POA: Diagnosis present

## 2021-08-19 LAB — CBC WITH DIFFERENTIAL/PLATELET
Abs Immature Granulocytes: 0.03 10*3/uL (ref 0.00–0.07)
Basophils Absolute: 0.1 10*3/uL (ref 0.0–0.1)
Basophils Relative: 1 %
Eosinophils Absolute: 0.1 10*3/uL (ref 0.0–0.5)
Eosinophils Relative: 1 %
HCT: 37.8 % (ref 36.0–46.0)
Hemoglobin: 12.4 g/dL (ref 12.0–15.0)
Immature Granulocytes: 0 %
Lymphocytes Relative: 10 %
Lymphs Abs: 0.8 10*3/uL (ref 0.7–4.0)
MCH: 34.3 pg — ABNORMAL HIGH (ref 26.0–34.0)
MCHC: 32.8 g/dL (ref 30.0–36.0)
MCV: 104.4 fL — ABNORMAL HIGH (ref 80.0–100.0)
Monocytes Absolute: 0.6 10*3/uL (ref 0.1–1.0)
Monocytes Relative: 8 %
Neutro Abs: 6.3 10*3/uL (ref 1.7–7.7)
Neutrophils Relative %: 80 %
Platelets: 63 10*3/uL — ABNORMAL LOW (ref 150–400)
RBC: 3.62 MIL/uL — ABNORMAL LOW (ref 3.87–5.11)
RDW: 14.2 % (ref 11.5–15.5)
WBC: 7.8 10*3/uL (ref 4.0–10.5)
nRBC: 0 % (ref 0.0–0.2)

## 2021-08-19 LAB — BASIC METABOLIC PANEL
Anion gap: 8 (ref 5–15)
BUN: 21 mg/dL (ref 8–23)
CO2: 23 mmol/L (ref 22–32)
Calcium: 9.5 mg/dL (ref 8.9–10.3)
Chloride: 111 mmol/L (ref 98–111)
Creatinine, Ser: 1.03 mg/dL — ABNORMAL HIGH (ref 0.44–1.00)
GFR, Estimated: 59 mL/min — ABNORMAL LOW (ref >60)
Glucose, Bld: 146 mg/dL — ABNORMAL HIGH (ref 70–99)
Potassium: 4.6 mmol/L (ref 3.5–5.1)
Sodium: 142 mmol/L (ref 135–145)

## 2021-08-19 MED ORDER — ONDANSETRON 4 MG PO TBDP
4.0000 mg | ORAL_TABLET | Freq: Once | ORAL | Status: AC
  Administered 2021-08-19: 4 mg via ORAL
  Filled 2021-08-19: qty 1

## 2021-08-19 MED ORDER — TETANUS-DIPHTH-ACELL PERTUSSIS 5-2.5-18.5 LF-MCG/0.5 IM SUSY
0.5000 mL | PREFILLED_SYRINGE | Freq: Once | INTRAMUSCULAR | Status: AC
  Administered 2021-08-19: 0.5 mL via INTRAMUSCULAR
  Filled 2021-08-19: qty 0.5

## 2021-08-19 MED ORDER — IOHEXOL 350 MG/ML SOLN
100.0000 mL | Freq: Once | INTRAVENOUS | Status: AC | PRN
  Administered 2021-08-19: 100 mL via INTRAVENOUS

## 2021-08-19 MED ORDER — SODIUM CHLORIDE 0.9 % IV BOLUS (SEPSIS)
1000.0000 mL | Freq: Once | INTRAVENOUS | Status: AC
  Administered 2021-08-19: 1000 mL via INTRAVENOUS

## 2021-08-19 NOTE — ED Provider Notes (Signed)
[facility] EMERGENCY DEPARTMENT Provider Note   CSN: [id number] Arrival date & time: 08/19/21  0448     History  Chief Complaint  Patient presents with   Marletta Lor    Heather Luna is a 71 y.o. female.  The history is provided by the patient.  Fall This is a new problem. Associated symptoms include headaches. Pertinent negatives include no chest pain and no abdominal pain. Nothing aggravates the symptoms. Nothing relieves the symptoms.  Patient with previous history of stroke presents after fall.  Patient reports she lost her balance in the bathroom striking her head in the fall.  Denies LOC.  She reports headache and laceration to her scalp.  She is on Aggrenox.  She has a history of chronic weakness to her left upper and lower extremity She had otherwise been at her baseline.  No other acute complaints    Home Medications Prior to Admission medications   Medication Sig Start Date End Date Taking? Authorizing Provider  acetaminophen (TYLENOL) 500 MG tablet Take 500 mg by mouth every 6 (six) hours as needed for mild pain.    [provider]  atorvastatin (LIPITOR) 40 MG tablet Take 40 mg by mouth at bedtime. 07/17/18   [provider]  b complex vitamins tablet Take 1 tablet by mouth daily. 07/05/11   [provider]  citalopram (CELEXA) 20 MG tablet Take 20 mg by mouth daily.    [provider]  CRANBERRY PO Take 1 tablet by mouth daily.    [provider]  dipyridamole-aspirin (AGGRENOX) 200-25 MG per 12 hr capsule Take 1 capsule by mouth 2 (two) times daily.    [provider]  lamoTRIgine (LAMICTAL) 25 MG tablet Take 75 mg by mouth 2 (two) times daily.    [provider]  metFORMIN (GLUCOPHAGE) 500 MG tablet Take 500 mg by mouth 2 (two) times daily. 03/31/20   [provider]  mirtazapine (REMERON) 15 MG tablet Take 7.5 mg by mouth at bedtime. 01/09/18   [provider]  oxybutynin  (DITROPAN) 5 MG tablet Take 5 mg by mouth 2 (two) times daily. 02/28/18   [provider]      Allergies    Septra [sulfamethoxazole-trimethoprim] and Levetiracetam    Review of Systems   Review of Systems  Cardiovascular:  Negative for chest pain.  Gastrointestinal:  Negative for abdominal pain.  Neurological:  Positive for headaches. Negative for weakness.   Physical Exam Updated Vital Signs BP 93/67   Pulse 73   Temp 97.9 F (36.6 C)   Resp (!) 24   Ht 1.6 m ( )   Wt 68 kg   SpO2 99%   BMI 26.57 kg/m  Physical Exam CONSTITUTIONAL: Elderly, no acute distress HEAD: Scalp laceration noted, no signs of trauma EYES: EOMI/PERRL ENMT: Mucous membranes moist NECK: Cervical collar in place SPINE/BACK:entire spine nontender CV: S1/S2 noted LUNGS: Lungs are clear to auscultation bilaterally, no apparent distress ABDOMEN: soft, nontender no bruising NEURO: Pt is awake/alert/appropriate, GCS 15 EXTREMITIES: pulses normal/equal, full ROM, brace noted to left lower extremity Left upper extremity contracture noted All other extremities/joints palpated/ranged and nontender SKIN: warm, color normal PSYCH: no abnormalities of mood noted, alert and oriented to situation  ED Results / Procedures / Treatments   Labs (all labs ordered are listed, but only abnormal results are displayed) Labs Reviewed  CBC WITH DIFFERENTIAL/PLATELET  BASIC METABOLIC PANEL    EKG EKG Interpretation  Date/Time:  Saturday August 19 2021 07:31:42 EDT Ventricular Rate:  76 PR Interval:  179 QRS Duration: 110 QT Interval:  462 QTC Calculation: 520 R Axis:   -46 Text Interpretation: Sinus rhythm LAD, consider left anterior fascicular block Confirmed by Virgina Norfolk ([id number]) on 08/19/2021 7:33:36 AM  Radiology CT Head Wo Contrast  Result Date: 08/19/2021 CLINICAL DATA:  71 year old female with history of trauma from a fall. Patient is on blood thinners. EXAM: CT HEAD WITHOUT CONTRAST CT  CERVICAL SPINE WITHOUT CONTRAST TECHNIQUE: Multidetector CT imaging of the head and cervical spine was performed following the standard protocol without intravenous contrast. Multiplanar CT image reconstructions of the cervical spine were also generated. RADIATION DOSE REDUCTION: This exam was performed according to the departmental dose-optimization program which includes automated exposure control, adjustment of the mA and/or kV according to patient size and/or use of iterative reconstruction technique. COMPARISON:  Head CT 06/24/2020.  No prior cervical spine CT. FINDINGS: CT HEAD FINDINGS Brain: Mild cerebral and cerebellar atrophy. Extensive low-attenuation throughout the medial right cerebral hemisphere, with slightly increased involvement throughout the right parieto-occipital region when compared to the prior study, with overlying cortical volume loss, indicative of encephalomalacia/gliosis from prior infarct(s). This is associated with some ex vacuo dilatation of the right lateral ventricle. Well-defined foci of low attenuation also noted in the left caudate, inferior left putamen and left cerebellar hemisphere, indicative old lacunar infarcts. No evidence of acute infarction, hemorrhage, hydrocephalus, extra-axial collection or mass lesion/mass effect. Vascular: No hyperdense vessel or unexpected calcification. Skull: Normal. Negative for fracture or focal lesion. Sinuses/Orbits: No acute finding. Other: Small amount of soft tissue swelling and high attenuation in the left parieto-occipital scalp, indicative of a scalp contusion/hematoma. CT CERVICAL SPINE FINDINGS Alignment: Reversal of normal cervical lordosis centered at the level of C5, likely chronic and degenerative. Alignment is otherwise anatomic. Skull base and vertebrae: No acute fracture. No primary bone lesion or focal pathologic process. Soft tissues and spinal canal: No prevertebral fluid or swelling. No visible canal hematoma. Disc levels:  Multilevel degenerative disc disease, most severe at C6-C7. Severe multilevel bilateral facet arthropathy. Upper chest: Unremarkable. Other: None. IMPRESSION: 1. Small left parieto-occipital scalp contusion/hematoma, without evidence of significant acute traumatic injury to the skull, brain or cervical spine. 2. Mild cerebral and cerebellar atrophy with chronic microvascular ischemic changes in the cerebral white matter, multiple old lacunar infarcts, and extensive encephalomalacia/gliosis throughout the right cerebral hemisphere, which has slightly worsened in the parieto-occipital region when compared to the prior study. 3. Multilevel degenerative disc disease and cervical spondylosis, as above. Electronically Signed   By: Trudie Reed M.D.   On: 08/19/2021 05:52   CT Cervical Spine Wo Contrast  Result Date: 08/19/2021 CLINICAL DATA:  71 year old female with history of trauma from a fall. Patient is on blood thinners. EXAM: CT HEAD WITHOUT CONTRAST CT CERVICAL SPINE WITHOUT CONTRAST TECHNIQUE: Multidetector CT imaging of the head and cervical spine was performed following the standard protocol without intravenous contrast. Multiplanar CT image reconstructions of the cervical spine were also generated. RADIATION DOSE REDUCTION: This exam was performed according to the departmental dose-optimization program which includes automated exposure control, adjustment of the mA and/or kV according to patient size and/or use of iterative reconstruction technique. COMPARISON:  Head CT 06/24/2020.  No prior cervical spine CT. FINDINGS: CT HEAD FINDINGS Brain: Mild cerebral and cerebellar atrophy. Extensive low-attenuation throughout the medial right cerebral hemisphere, with slightly increased involvement throughout the right parieto-occipital region when compared to the prior study, with overlying cortical  volume loss, indicative of encephalomalacia/gliosis from prior infarct(s). This is associated with some ex vacuo  dilatation of the right lateral ventricle. Well-defined foci of low attenuation also noted in the left caudate, inferior left putamen and left cerebellar hemisphere, indicative old lacunar infarcts. No evidence of acute infarction, hemorrhage, hydrocephalus, extra-axial collection or mass lesion/mass effect. Vascular: No hyperdense vessel or unexpected calcification. Skull: Normal. Negative for fracture or focal lesion. Sinuses/Orbits: No acute finding. Other: Small amount of soft tissue swelling and high attenuation in the left parieto-occipital scalp, indicative of a scalp contusion/hematoma. CT CERVICAL SPINE FINDINGS Alignment: Reversal of normal cervical lordosis centered at the level of C5, likely chronic and degenerative. Alignment is otherwise anatomic. Skull base and vertebrae: No acute fracture. No primary bone lesion or focal pathologic process. Soft tissues and spinal canal: No prevertebral fluid or swelling. No visible canal hematoma. Disc levels: Multilevel degenerative disc disease, most severe at C6-C7. Severe multilevel bilateral facet arthropathy. Upper chest: Unremarkable. Other: None. IMPRESSION: 1. Small left parieto-occipital scalp contusion/hematoma, without evidence of significant acute traumatic injury to the skull, brain or cervical spine. 2. Mild cerebral and cerebellar atrophy with chronic microvascular ischemic changes in the cerebral white matter, multiple old lacunar infarcts, and extensive encephalomalacia/gliosis throughout the right cerebral hemisphere, which has slightly worsened in the parieto-occipital region when compared to the prior study. 3. Multilevel degenerative disc disease and cervical spondylosis, as above. Electronically Signed   By: Trudie Reedaniel  Entrikin M.D.   On: 08/19/2021 05:52    Procedures .Marland Kitchen.Laceration Repair  Date/Time: 08/19/2021 6:56 AM Performed by: Zadie RhineWickline, Jaxyn Rout, MD Authorized by: Zadie RhineWickline, Sherron Mapp, MD   Consent:    Consent obtained:  Verbal   Consent  given by:  Patient Universal protocol:    Patient identity confirmed:  Provided demographic data Anesthesia:    Anesthesia method:  None Laceration details:    Location:  Scalp   Length (cm):  3 Exploration:    Contaminated: no   Treatment:    Amount of cleaning:  Standard Skin repair:    Repair method:  Staples   Number of staples:  3 Approximation:    Approximation:  Loose Repair type:    Repair type:  Simple Post-procedure details:    Procedure completion:  Tolerated well, no immediate complications .Critical Care Performed by: Zadie RhineWickline, Natashia Roseman, MD Authorized by: Zadie RhineWickline, Lindyn Vossler, MD   Critical care provider statement:    Critical care time (minutes):  34   Critical care start time:  08/19/2021 6:50 AM   Critical care end time:  08/19/2021 7:24 AM   Critical care time was exclusive of:  Separately billable procedures and treating other patients   Critical care was necessary to treat or prevent imminent or life-threatening deterioration of the following conditions:  Shock and trauma   Critical care was time spent personally by me on the following activities:  Discussions with consultants, development of treatment plan with patient or surrogate, examination of patient, ordering and review of radiographic studies and re-evaluation of patient's condition   I assumed direction of critical care for this patient from another provider in my specialty: no      Medications Ordered in ED Medications  sodium chloride 0.9 % bolus 1,000 mL (has no administration in time range)  Tdap (BOOSTRIX) injection 0.5 mL (0.5 mLs Intramuscular Given 08/19/21 0518)  ondansetron (ZOFRAN-ODT) disintegrating tablet 4 mg (4 mg Oral Given 08/19/21 0651)    ED Course/ Medical Decision Making/ A&P Clinical Course as of 08/19/21 0737  Sat  Aug 19, 2021  7616 After suture repair, patient became nauseous and her blood pressure dropped to the 60s.  Suspect this may have been due to the procedure.  Her blood  pressure is now recovering into the 80s and 90s.  On repeat assessment she is awake and alert she has no other new complaints but does have mild abdominal tenderness but there is no bruising [DW]  0723 We will expand the trauma work-up since patient only had CT head and C-spine originally.  She initially had no other signs of trauma, but now due to drop in her blood pressure this will be expanded.  She is high risk for bleeding since she is on [DW]  0723 Aggrenox [DW]  [phone] Signed out to dr Lockie Mola at shift to f/u on labs/imaging.   [DW]  0737 BP is improving spontaneously, however we will proceed with further work-up [DW]    Clinical Course User Index [DW] Zadie Rhine, MD                           Medical Decision Making Amount and/or Complexity of Data Reviewed Labs: ordered. Radiology: ordered. ECG/medicine tests: ordered.  Risk Prescription drug management.   This patient presents to the ED for concern of head injury and on antiplatelet therapy, this involves an extensive number of treatment options, and is a complaint that carries with it a high risk of complications and morbidity.  The differential diagnosis includes but is not limited to subdural hematoma, subarachnoid hemorrhage, intracranial hemorrhage, skull  Comorbidities that complicate the patient evaluation: Patient's presentation is complicated by their history of previous history of stroke  Additional history obtained: Additional history obtained from EMS discussed with EMS at bedside   Imaging Studies ordered: I ordered imaging studies including CT scan head and C-spine   I independently visualized and interpreted imaging which showed no acute finding I agree with the radiologist interpretation  Cardiac Monitoring: The patient was maintained on a cardiac monitor.  I personally viewed and interpreted the cardiac monitor which showed an underlying rhythm of:  sinus rhythm  Reevaluation: After the  interventions noted above, I reevaluated the patient and found that they have :stayed the same  Complexity of problems addressed: Patient's presentation is most consistent with  acute presentation with potential threat to life or bodily function            Final Clinical Impression(s) / ED Diagnoses Final diagnoses:  Laceration of scalp, initial encounter  Concussion without loss of consciousness, initial encounter  Strain of neck muscle, initial encounter    Rx / DC Orders ED Discharge Orders     None         Zadie Rhine, MD 08/19/21 [phone]

## 2021-08-19 NOTE — ED Notes (Signed)
MD made aware of hypotension, orders received

## 2021-08-19 NOTE — ED Notes (Addendum)
Pt arrived via [facility] for level 2 fall on blood thinners (alggrenox) with head injury. Pt suffered mechanical fall in bathroom striking head on window sill. Pt has left sided deficits from previous stroke affecting upper and lower extremities. EMS report approx 1 inch laceration to back left side of head, bleeding controlled, moderate amount of blood reported on scene. GCS 15, A&Ox4. C-collar in place.   PTA Vitals BP 115/76 HR 76 SPO2 97% RR 17

## 2021-08-19 NOTE — Discharge Instructions (Signed)
you will need staples removed in 10 days.  Follow-up with your primary care doctor to discuss concern for possible cirrhosis of liver as discussed.

## 2021-08-19 NOTE — ED Notes (Addendum)
Patient transported to CT with primary RN. 

## 2021-08-19 NOTE — ED Provider Notes (Signed)
Patient overall signed out to me awaiting further trauma images after a fall.  She is on Aggrenox.  She had head laceration that was repaired.  She seemed to have a vagal episode while having had laceration repaired.  She has some borderline low blood pressures for a little bit afterwards but when I readjusted the cuff blood pressures seem to stabilize.  This was prior to IV fluids which she was given as well.  She does have low blood pressure at baseline.  Blood work is overall unremarkable.  Hemoglobin 12.4.  CT scans with no traumatic injuries.  There was question of may be cirrhosis with stigmata of portal venous hypertension.  No mention of this in patient's medical history.  She is not aware of this.  Denies any alcohol use.  Overall incidental finding.  We will have her follow-up with her primary care doctor about this.  Otherwise she is asymptomatic.  Vital signs have been stable.  Discharged in good condition.  This chart was dictated using voice recognition software.  Despite best efforts to proofread,  errors can occur which can change the documentation meaning.    Lennice Sites, DO 08/19/21 1030

## 2021-08-19 NOTE — ED Notes (Signed)
Pt reported nausea, MD made aware, verbal order for zofran recieved

## 2021-08-19 NOTE — ED Notes (Signed)
Per MD, no blood collection required at this time.

## 2021-08-19 NOTE — ED Notes (Addendum)
C-collar removed. Significant amount of blood cleaned from patient's hair/scalp to show soft tissue swelling and laceration to the back, top, left of head.

## 2021-08-19 NOTE — Progress Notes (Signed)
Orthopedic Tech Progress Note Patient Details:  Heather Luna Sep 13, 1950 [id number]  Patient ID: [name], female   DOB: 1950-04-26, 71 y.o.   MRN: [id number] Level 2 trauma not needed. Artis Flock Elmore Hyslop 08/19/2021, 4:50 AM

## 2021-08-25 ENCOUNTER — Emergency Department (HOSPITAL_BASED_OUTPATIENT_CLINIC_OR_DEPARTMENT_OTHER): Payer: Medicare HMO | Admitting: Radiology

## 2021-08-25 ENCOUNTER — Other Ambulatory Visit: Payer: Self-pay

## 2021-08-25 ENCOUNTER — Encounter (HOSPITAL_BASED_OUTPATIENT_CLINIC_OR_DEPARTMENT_OTHER): Payer: Self-pay | Admitting: Emergency Medicine

## 2021-08-25 ENCOUNTER — Emergency Department (HOSPITAL_BASED_OUTPATIENT_CLINIC_OR_DEPARTMENT_OTHER)
Admission: EM | Admit: 2021-08-25 | Discharge: 2021-08-25 | Disposition: A | Discharge status: Home / Self Care | Payer: Medicare HMO | Attending: Emergency Medicine | Admitting: Emergency Medicine

## 2021-08-25 DIAGNOSIS — W01198A Fall on same level from slipping, tripping and stumbling with subsequent striking against other object, initial encounter: Secondary | ICD-10-CM | POA: Diagnosis not present

## 2021-08-25 DIAGNOSIS — S42035A Nondisplaced fracture of lateral end of left clavicle, initial encounter for closed fracture: Secondary | ICD-10-CM | POA: Diagnosis not present

## 2021-08-25 DIAGNOSIS — S4992XA Unspecified injury of left shoulder and upper arm, initial encounter: Secondary | ICD-10-CM | POA: Diagnosis present

## 2021-08-25 DIAGNOSIS — S42202A Unspecified fracture of upper end of left humerus, initial encounter for closed fracture: Secondary | ICD-10-CM

## 2021-08-25 NOTE — ED Notes (Signed)
Patient verbalizes understanding of discharge instructions. Opportunity for questioning and answers were provided. Patient discharged from ED.  °

## 2021-08-25 NOTE — ED Triage Notes (Addendum)
Pt had a fall last week. She was seen in the ED. She has since developed bruising to L shoulder and now c/o pain from shoulder to elbow. She takes blood thinners.

## 2021-08-25 NOTE — Discharge Instructions (Signed)
Follow up with the orthopedist as discussed.  Follow up with your primary care provider regarding your low blood pressure.  Return to the emergency room for any worsening symptoms.

## 2021-08-25 NOTE — ED Provider Notes (Signed)
[facility] EMERGENCY DEPT Provider Note   CSN: [id number] Arrival date & time: 08/25/21  1134     History  Chief Complaint  Patient presents with   Heather Luna    Heather Luna is a 71 y.o. female.  Patient is a 71 year old female who presents with left shoulder pain.  She recently had a fall about 8 days ago where she fell backward, hitting her head on the ground.  She was seen here in the emergency department.  She had a CT scan of her head and cervical spine which showed no acute abnormalities.  There was a laceration that was repaired.  During the repair of the laceration, she had a vasovagal type episode and had more extensive imaging including chest abdomen pelvis.  No acute traumatic injuries were noted.  She is overall been doing well.  However over the last couple days she has been complaining of pain in her left shoulder.  She thinks she may have injured it during the fall.  She otherwise does not report any illnesses.  No dizziness.  No fatigue.  No increased weakness.  No chest pain or shortness of breath.  No abdominal pain.  No vomiting or diarrhea.  Her blood pressure is noted to be a little bit on the low side.  She reports that she thinks it is always low.  On chart review, during her last visit it was similar.  She is not on any antihypertensive medications.       Home Medications Prior to Admission medications   Medication Sig Start Date End Date Taking? Authorizing Provider  acetaminophen (TYLENOL) 500 MG tablet Take 500 mg by mouth every 6 (six) hours as needed for mild pain.    [provider]  atorvastatin (LIPITOR) 40 MG tablet Take 40 mg by mouth at bedtime. 07/17/18   [provider]  b complex vitamins tablet Take 1 tablet by mouth daily. 07/05/11   [provider]  citalopram (CELEXA) 20 MG tablet Take 20 mg by mouth daily.    [provider]  CRANBERRY PO Take 1 tablet by mouth daily.    [provider]   dipyridamole-aspirin (AGGRENOX) 200-25 MG per 12 hr capsule Take 1 capsule by mouth 2 (two) times daily.    [provider]  lamoTRIgine (LAMICTAL) 25 MG tablet Take 75 mg by mouth 2 (two) times daily.    [provider]  metFORMIN (GLUCOPHAGE) 500 MG tablet Take 500 mg by mouth 2 (two) times daily. 03/31/20   [provider]  mirtazapine (REMERON) 15 MG tablet Take 7.5 mg by mouth at bedtime. 01/09/18   [provider]  oxybutynin (DITROPAN) 5 MG tablet Take 5 mg by mouth 2 (two) times daily. 02/28/18   [provider]      Allergies    Septra [sulfamethoxazole-trimethoprim] and Levetiracetam    Review of Systems   Review of Systems  Constitutional:  Negative for chills, diaphoresis, fatigue and fever.  HENT:  Negative for congestion, rhinorrhea and sneezing.   Eyes: Negative.   Respiratory:  Negative for cough, chest tightness and shortness of breath.   Cardiovascular:  Negative for chest pain and leg swelling.  Gastrointestinal:  Negative for abdominal pain, blood in stool, diarrhea, nausea and vomiting.  Genitourinary:  Negative for difficulty urinating, flank pain, frequency and hematuria.  Musculoskeletal:  Positive for arthralgias. Negative for back pain.  Skin:  Negative for rash.  Neurological:  Negative for dizziness, speech difficulty, weakness, numbness  and headaches.    Physical Exam Updated Vital Signs BP (!) 98/57   Pulse 66   Temp 98.7 F (37.1 C)   Resp 18   Ht 5\' 3"  (1.6 m)   Wt 90.7 kg   SpO2 92%   BMI 35.43 kg/m  Physical Exam Constitutional:      Appearance: She is well-developed.  HENT:     Head: Normocephalic and atraumatic.  Eyes:     Pupils: Pupils are equal, round, and reactive to light.  Cardiovascular:     Rate and Rhythm: Normal rate and regular rhythm.     Heart sounds: Normal heart sounds.  Pulmonary:     Effort: Pulmonary effort is normal. No respiratory distress.     Breath sounds: Normal  breath sounds. No wheezing or rales.  Chest:     Chest wall: No tenderness.  Abdominal:     General: Bowel sounds are normal.     Palpations: Abdomen is soft.     Tenderness: There is no abdominal tenderness. There is no guarding or rebound.  Musculoskeletal:        General: Normal range of motion.     Cervical back: Normal range of motion and neck supple.     Comments: Positive tenderness over the left distal clavicle and the anterior shoulder.  There are some pain to the proximal humerus on the left.  There are some ecchymosis over the anterior shoulder.  She has no pain to the elbow or wrist.  She is got paralysis in this arm so has some chronic swelling.  She has no movement and limited sensation.  This is baseline for her.  Perfusion appears intact.  Lymphadenopathy:     Cervical: No cervical adenopathy.  Skin:    General: Skin is warm and dry.     Findings: No rash.  Neurological:     Mental Status: She is alert and oriented to person, place, and time.     ED Results / Procedures / Treatments   Labs (all labs ordered are listed, but only abnormal results are displayed) Labs Reviewed - No data to display  EKG None  Radiology DG Shoulder Left  Result Date: 08/25/2021 CLINICAL DATA:  Fall last week.  Shoulder pain. EXAM: LEFT SHOULDER - 2+ VIEW COMPARISON:  None Available. FINDINGS: There is diffuse decreased bone mineralization. Moderate to severe inferior glenohumeral joint space narrowing and peripheral osteophytes. There is moderate medial humeral head cortical flattening/remodeling likely from chronic degenerative change. Moderate left acromioclavicular joint space narrowing and peripheral osteophytosis. There appears to be approximate 4 mm cortical step-off of the lateral aspect of the clavicle, mildly displaced fracture near the clavicular head. There is mild up turning of the clavicular head at the acromioclavicular joint but otherwise no dislocation or separation. There is  very low density within the proximal humeral diaphysis which limits evaluation for an acute fracture. There is an oblique linear lucency overlying this portion of the bone on frontal view which may represent an acute fracture versus overlying soft tissue markings. On oblique view, there are lucencies overlying the bone and the overlying lung in this same region. Question mild proximal medial humeral diaphyseal periosteal thickening, possibly from partial healing of a nondisplaced fracture. IMPRESSION: 1. Acute mildly displaced fracture of the lateral aspect of the clavicular head. 2. Decreased bone mineralization markedly limits evaluation for acute fractures, and it is difficult to exclude an acute nondisplaced fracture with possible healing periosteal reaction within the proximal humeral diaphysis. Recommend  correlation for point tenderness. Electronically Signed   By: Yvonne Kendall M.D.   On: 08/25/2021 14:05    Procedures Procedures    Medications Ordered in ED Medications - No data to display  ED Course/ Medical Decision Making/ A&P                           Medical Decision Making  Patient had x-rays done of her left shoulder.  These were interpreted by me.  There is a nondisplaced fracture of the distal clavicle.  There is a questionable fracture of the proximal humerus which also appears nondisplaced.  She was placed in a shoulder sling.  It appears that this is likely nonoperative.  However advised patient's symptomatic care and gave her a referral to follow-up with orthopedics.  Also discussed with the patient and the patient's daughter that her blood pressure is little on the low side and she needs to follow-up with her primary care doctor regarding this.  She is currently asymptomatic.  She is not tachycardic.  She had an extensive evaluation on her previous visit with blood work which was nonconcerning.  Final Clinical Impression(s) / ED Diagnoses Final diagnoses:  Nondisplaced  fracture of lateral end of left clavicle, initial encounter for closed fracture  Closed fracture of proximal end of left humerus, unspecified fracture morphology, initial encounter    Rx / DC Orders ED Discharge Orders     None         Malvin Johns, MD 08/25/21 1645

## 2022-12-30 ENCOUNTER — Other Ambulatory Visit: Payer: Self-pay

## 2022-12-30 ENCOUNTER — Emergency Department (HOSPITAL_BASED_OUTPATIENT_CLINIC_OR_DEPARTMENT_OTHER): Payer: Medicare HMO | Admitting: Radiology

## 2022-12-30 ENCOUNTER — Emergency Department (HOSPITAL_BASED_OUTPATIENT_CLINIC_OR_DEPARTMENT_OTHER): Payer: Medicare HMO

## 2022-12-30 ENCOUNTER — Emergency Department (HOSPITAL_BASED_OUTPATIENT_CLINIC_OR_DEPARTMENT_OTHER)
Admission: EM | Admit: 2022-12-30 | Discharge: 2022-12-30 | Disposition: A | Discharge status: Home / Self Care | Payer: Medicare HMO | Attending: Emergency Medicine | Admitting: Emergency Medicine

## 2022-12-30 DIAGNOSIS — S42295A Other nondisplaced fracture of upper end of left humerus, initial encounter for closed fracture: Secondary | ICD-10-CM | POA: Diagnosis not present

## 2022-12-30 DIAGNOSIS — Z7984 Long term (current) use of oral hypoglycemic drugs: Secondary | ICD-10-CM | POA: Diagnosis not present

## 2022-12-30 DIAGNOSIS — M542 Cervicalgia: Secondary | ICD-10-CM | POA: Diagnosis not present

## 2022-12-30 DIAGNOSIS — R531 Weakness: Secondary | ICD-10-CM | POA: Insufficient documentation

## 2022-12-30 DIAGNOSIS — W19XXXA Unspecified fall, initial encounter: Secondary | ICD-10-CM | POA: Diagnosis not present

## 2022-12-30 DIAGNOSIS — E119 Type 2 diabetes mellitus without complications: Secondary | ICD-10-CM | POA: Insufficient documentation

## 2022-12-30 DIAGNOSIS — R519 Headache, unspecified: Secondary | ICD-10-CM | POA: Diagnosis not present

## 2022-12-30 DIAGNOSIS — S4992XA Unspecified injury of left shoulder and upper arm, initial encounter: Secondary | ICD-10-CM | POA: Diagnosis present

## 2022-12-30 MED ORDER — HYDROCODONE-ACETAMINOPHEN 5-325 MG PO TABS
1.0000 | ORAL_TABLET | Freq: Four times a day (QID) | ORAL | 0 refills | Status: DC | PRN
Start: 2022-12-30 — End: 2023-01-18

## 2022-12-30 NOTE — ED Provider Notes (Signed)
[place] EMERGENCY DEPARTMENT AT [facility] Provider Note   CSN: [id number] Arrival date & time: 12/30/22  1123     History  Chief Complaint  Patient presents with   Fall   fall on blood thinners    Heather Luna is a 72 y.o. female.   Fall  72 year old female history of prior stroke with left-sided weakness, diabetes presenting for fall.  Patient states on Friday she lost her balance and fell and landed on her left arm/shoulder.  Did not hit her head.  She is on anticoagulation.  She has had mild headache and some lateral neck pain no midline neck pain.  No back pain, abdominal pain, chest pain or difficulty breathing.  She mostly has pain over her left humerus.  She has weakness and numbness over her left arm from prior stroke.  But she has pain from her left humerus to her left elbow.  No pain to her hand.  No new weakness or numbness.     Home Medications Prior to Admission medications   Medication Sig Start Date End Date Taking? Authorizing Provider  HYDROcodone-acetaminophen (NORCO/VICODIN) 5-325 MG tablet Take 1 tablet by mouth every 6 (six) hours as needed for severe pain. 12/30/22  Yes Laurence Spates, MD  atorvastatin (LIPITOR) 40 MG tablet Take 40 mg by mouth at bedtime. 07/17/18   [provider]  b complex vitamins tablet Take 1 tablet by mouth daily. 07/05/11   [provider]  citalopram (CELEXA) 20 MG tablet Take 20 mg by mouth daily.    [provider]  CRANBERRY PO Take 1 tablet by mouth daily.    [provider]  dipyridamole-aspirin (AGGRENOX) 200-25 MG per 12 hr capsule Take 1 capsule by mouth 2 (two) times daily.    [provider]  lamoTRIgine (LAMICTAL) 25 MG tablet Take 75 mg by mouth 2 (two) times daily.    [provider]  metFORMIN (GLUCOPHAGE) 500 MG tablet Take 500 mg by mouth 2 (two) times daily. 03/31/20   [provider]  mirtazapine (REMERON) 15 MG tablet Take 7.5 mg  by mouth at bedtime. 01/09/18   [provider]  oxybutynin (DITROPAN) 5 MG tablet Take 5 mg by mouth 2 (two) times daily. 02/28/18   [provider]      Allergies    Septra [sulfamethoxazole-trimethoprim] and Levetiracetam    Review of Systems   Review of Systems Review of systems completed and notable as per HPI.  ROS otherwise negative.   Physical Exam Updated Vital Signs BP (!) 99/55 (BP Location: Right Arm)   Pulse 74   Temp 98.7 F (37.1 C) (Oral)   Resp 18   Wt 90.7 kg   SpO2 94%   BMI 35.43 kg/m  Physical Exam Vitals and nursing note reviewed.  Constitutional:      General: She is not in acute distress.    Appearance: She is well-developed.  HENT:     Head: Normocephalic and atraumatic.     Nose: Nose normal.     Mouth/Throat:     Mouth: Mucous membranes are moist.     Pharynx: Oropharynx is clear.  Eyes:     Extraocular Movements: Extraocular movements intact.     Conjunctiva/sclera: Conjunctivae normal.     Pupils: Pupils are equal, round, and reactive to light.  Cardiovascular:     Rate and Rhythm: Normal rate and regular rhythm.     Pulses: Normal pulses.     Heart  sounds: Normal heart sounds. No murmur heard. Pulmonary:     Effort: Pulmonary effort is normal. No respiratory distress.     Breath sounds: Normal breath sounds.  Abdominal:     Palpations: Abdomen is soft.     Tenderness: There is no abdominal tenderness.  Musculoskeletal:        General: No swelling.     Cervical back: Normal range of motion and neck supple. No rigidity or tenderness.     Right lower leg: No edema.     Left lower leg: No edema.     Comments: Mild tenderness over the humeral head.  No tenderness about the elbow, forearm, wrist, hand.  Mild bruising over the anterior left humerus.  Skin:    General: Skin is warm and dry.     Capillary Refill: Capillary refill takes less than 2 seconds.  Neurological:     Mental Status: She is alert and oriented to  person, place, and time. Mental status is at baseline.     Comments: At baseline.  Left-sided weakness.  Psychiatric:        Mood and Affect: Mood normal.     ED Results / Procedures / Treatments   Labs (all labs ordered are listed, but only abnormal results are displayed) Labs Reviewed - No data to display  EKG None  Radiology CT Head Wo Contrast  Result Date: 12/30/2022 CLINICAL DATA:  Head trauma, minor (Age >= 65y); Neck trauma (Age >= 65y). Fall from sitting. Prior stroke. EXAM: CT HEAD WITHOUT CONTRAST CT CERVICAL SPINE WITHOUT CONTRAST TECHNIQUE: Multidetector CT imaging of the head and cervical spine was performed following the standard protocol without intravenous contrast. Multiplanar CT image reconstructions of the cervical spine were also generated. RADIATION DOSE REDUCTION: This exam was performed according to the departmental dose-optimization program which includes automated exposure control, adjustment of the mA and/or kV according to patient size and/or use of iterative reconstruction technique. COMPARISON:  CT head and cervical spine 08/19/2021. FINDINGS: CT HEAD FINDINGS Brain: No acute hemorrhage. 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 Unchanged old perforator infarcts in the left caudate, right thalamus and bilateral cerebellar hemispheres. No hydrocephalus or extra-axial collection. No mass effect or midline shift. Vascular: No hyperdense vessel or unexpected calcification. Skull: No calvarial fracture or suspicious bone lesion. Skull base is unremarkable. Sinuses/Orbits: No acute finding. Other: None. CT CERVICAL SPINE FINDINGS Alignment: Unchanged 3 mm degenerative anterolisthesis of C4 on C5. No traumatic malalignment. Skull base and vertebrae: No acute fracture. Normal craniocervical junction. No suspicious bone lesions. Soft tissues and spinal canal: No prevertebral fluid or swelling. No visible canal hematoma. Disc levels:  Unchanged multilevel cervical spondylosis without high-grade spinal canal stenosis. Upper chest: No acute findings. Other: None. IMPRESSION: 1. No acute intracranial abnormality. 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 2. No acute cervical spine fracture or traumatic malalignment. Electronically Signed   By: Orvan Falconer M.D.   On: 12/30/2022 13:14   CT Cervical Spine Wo Contrast  Result Date: 12/30/2022 CLINICAL DATA:  Head trauma, minor (Age >= 65y); Neck trauma (Age >= 65y). Fall from sitting. Prior stroke. EXAM: CT HEAD WITHOUT CONTRAST CT CERVICAL SPINE WITHOUT CONTRAST TECHNIQUE: Multidetector CT imaging of the head and cervical spine was performed following the standard protocol without intravenous contrast. Multiplanar CT image reconstructions of the cervical spine were also generated. RADIATION DOSE REDUCTION: This exam was performed according to the departmental dose-optimization program which includes automated  exposure control, adjustment of the mA and/or kV according to patient size and/or use of iterative reconstruction technique. COMPARISON:  CT head and cervical spine 08/19/2021. FINDINGS: CT HEAD FINDINGS Brain: No acute hemorrhage. 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 Unchanged old perforator infarcts in the left caudate, right thalamus and bilateral cerebellar hemispheres. No hydrocephalus or extra-axial collection. No mass effect or midline shift. Vascular: No hyperdense vessel or unexpected calcification. Skull: No calvarial fracture or suspicious bone lesion. Skull base is unremarkable. Sinuses/Orbits: No acute finding. Other: None. CT CERVICAL SPINE FINDINGS Alignment: Unchanged 3 mm degenerative anterolisthesis of C4 on C5. No traumatic malalignment. Skull base and vertebrae: No acute fracture. Normal craniocervical junction. No suspicious bone lesions. Soft tissues and spinal canal: No  prevertebral fluid or swelling. No visible canal hematoma. Disc levels: Unchanged multilevel cervical spondylosis without high-grade spinal canal stenosis. Upper chest: No acute findings. Other: None. IMPRESSION: 1. No acute intracranial abnormality. 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 2. No acute cervical spine fracture or traumatic malalignment. Electronically Signed   By: Orvan Falconer M.D.   On: 12/30/2022 13:14   DG Humerus Left  Result Date: 12/30/2022 CLINICAL DATA:  Left arm pain. Lost balance. Patient with history of stroke and limited mobility of the left upper extremity. EXAM: LEFT HUMERUS - 2+ VIEW; LEFT SHOULDER - 2+ VIEW; LEFT ELBOW - 2 VIEW COMPARISON:  Shoulder radiograph 08/25/2021 FINDINGS: Shoulder: Suspected impacted fracture of the humeral neck. Fracture line extends through the surgical neck and is mildly displaced. Glenohumeral alignment is suboptimally assessed, but grossly stable from prior. 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 The previous distal clavicle fracture has healed. Suspected os acromial. Acromioclavicular degenerative change. Humerus: No additional fracture of the humerus. Chronic periosteal thickening about the proximal humerus. Elbow: No evidence of fracture or dislocation. No gross joint effusion, technically limited assessment due to positioning. No focal soft tissue abnormalities. IMPRESSION: 1. Suspected impacted fracture of the humeral neck. Fracture line extends through the surgical neck and is mildly displaced. 2. 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 3. No additional fracture of the humerus or elbow. Electronically Signed   By: Narda Rutherford M.D.   On: 12/30/2022 13:11   DG Shoulder Left  Result Date: 12/30/2022 CLINICAL DATA:  Left arm pain. Lost balance. Patient with  history of stroke and limited mobility of the left upper extremity. EXAM: LEFT HUMERUS - 2+ VIEW; LEFT SHOULDER - 2+ VIEW; LEFT ELBOW - 2 VIEW COMPARISON:  Shoulder radiograph 08/25/2021 FINDINGS: Shoulder: Suspected impacted fracture of the humeral neck. Fracture line extends through the surgical neck and is mildly displaced. Glenohumeral alignment is suboptimally assessed, but grossly stable from prior. 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 The previous distal clavicle fracture has healed. Suspected os acromial. Acromioclavicular degenerative change. Humerus: No additional fracture of the humerus. Chronic periosteal thickening about the proximal humerus. Elbow: No evidence of fracture or dislocation. No gross joint effusion, technically limited assessment due to positioning. No focal soft tissue abnormalities. IMPRESSION: 1. Suspected impacted fracture of the humeral neck. Fracture line extends through the surgical neck and is mildly displaced. 2. 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 3. No additional fracture of the humerus or elbow. Electronically Signed   By: Ivette Loyal.D.  On: 12/30/2022 13:11   DG Elbow 2 Views Left  Result Date: 12/30/2022 CLINICAL DATA:  Left arm pain. Lost balance. Patient with history of stroke and limited mobility of the left upper extremity. EXAM: LEFT HUMERUS - 2+ VIEW; LEFT SHOULDER - 2+ VIEW; LEFT ELBOW - 2 VIEW COMPARISON:  Shoulder radiograph 08/25/2021 FINDINGS: Shoulder: Suspected impacted fracture of the humeral neck. Fracture line extends through the surgical neck and is mildly displaced. Glenohumeral alignment is suboptimally assessed, but grossly stable from prior. 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 The previous distal clavicle fracture has healed.  Suspected os acromial. Acromioclavicular degenerative change. Humerus: No additional fracture of the humerus. Chronic periosteal thickening about the proximal humerus. Elbow: No evidence of fracture or dislocation. No gross joint effusion, technically limited assessment due to positioning. No focal soft tissue abnormalities. IMPRESSION: 1. Suspected impacted fracture of the humeral neck. Fracture line extends through the surgical neck and is mildly displaced. 2. 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 3. No additional fracture of the humerus or elbow. Electronically Signed   By: Narda Rutherford M.D.   On: 12/30/2022 13:11    Procedures Procedures    Medications Ordered in ED Medications - No data to display  ED Course/ Medical Decision Making/ A&P                                 Medical Decision Making Amount and/or Complexity of Data Reviewed Radiology: ordered.   Medical Decision Making:   Heather Luna is a 72 y.o. female who presented to the ED today with mechanical fall.  She has pain to her left humerus as well as mild headache and neck pain.  She is on anticoagulation.  Fall happened a couple days ago, presents today due to worsening bruising and pain over the left humerus.  I am concerned for fracture.  2+ radial pulse without signs of neurovascular compromise.  She has no other signs of injury.  However given anticoagulation and headache obtain CT head and cervical spine as well as plain films of the left arm.  CT head and cervical spine reviewed, no signs of acute intracranial or C-spine injury.  X-ray of the left lower extremity notable for left humeral fracture.  Slightly impacted and displaced.  Not dislocated.  Discussed with Dr. Ave Filter with orthopedics who recommended sling and outpatient follow-up.  Daughter is able to help patient at home, given sling and short course of pain medication.  Discharged with  return precautions.   Additional history discussed with patient's family/caregivers.  Patient placed on continuous vitals and telemetry monitoring while in ED which was reviewed periodically.  Reviewed and confirmed nursing documentation for past medical history, family history, social history.   Patient's presentation is most consistent with acute complicated illness / injury requiring diagnostic workup.           Final Clinical Impression(s) / ED Diagnoses Final diagnoses:  Other closed nondisplaced fracture of proximal end of left humerus, initial encounter    Rx / DC Orders ED Discharge Orders          Ordered    HYDROcodone-acetaminophen (NORCO/VICODIN) 5-325 MG tablet  Every 6 hours PRN        12/30/22 1406              Laurence Spates, MD 12/30/22  1406  

## 2022-12-30 NOTE — ED Notes (Signed)
Reviewed AVS/discharge instruction with patient. Time allotted for and all questions answered. Patient is agreeable for d/c and escorted to ed exit by staff.  

## 2022-12-30 NOTE — Discharge Instructions (Addendum)
You were seen today for arm pain.  You have a fracture of your left humerus.  Your CT scans were reassuring.  You should keep the sling on can take to prescribe pain medication for pain.  You should follow-up with orthopedics and your primary care doctor.

## 2022-12-30 NOTE — ED Triage Notes (Signed)
Pt leaned over and lost balance while she was sitting. Left arm/leg are flaccid from stroke. She hit her left arm /shoulder Friday. Today bruised worse.

## 2022-12-30 NOTE — ED Notes (Signed)
Helped to restroom

## 2023-01-02 ENCOUNTER — Encounter: Payer: Self-pay | Admitting: Urgent Care

## 2023-01-02 ENCOUNTER — Ambulatory Visit: Payer: Medicare HMO | Admitting: Urgent Care

## 2023-01-02 VITALS — BP 114/75 | HR 68 | Temp 98.0°F | Ht 63.0 in | Wt 174.0 lb

## 2023-01-02 DIAGNOSIS — I63521 Cerebral infarction due to unspecified occlusion or stenosis of right anterior cerebral artery: Secondary | ICD-10-CM

## 2023-01-02 DIAGNOSIS — Z7984 Long term (current) use of oral hypoglycemic drugs: Secondary | ICD-10-CM

## 2023-01-02 DIAGNOSIS — N390 Urinary tract infection, site not specified: Secondary | ICD-10-CM | POA: Diagnosis not present

## 2023-01-02 DIAGNOSIS — R350 Frequency of micturition: Secondary | ICD-10-CM

## 2023-01-02 DIAGNOSIS — I69352 Hemiplegia and hemiparesis following cerebral infarction affecting left dominant side: Secondary | ICD-10-CM | POA: Diagnosis not present

## 2023-01-02 DIAGNOSIS — Z8669 Personal history of other diseases of the nervous system and sense organs: Secondary | ICD-10-CM | POA: Insufficient documentation

## 2023-01-02 DIAGNOSIS — F339 Major depressive disorder, recurrent, unspecified: Secondary | ICD-10-CM

## 2023-01-02 DIAGNOSIS — K7460 Unspecified cirrhosis of liver: Secondary | ICD-10-CM

## 2023-01-02 DIAGNOSIS — E1149 Type 2 diabetes mellitus with other diabetic neurological complication: Secondary | ICD-10-CM | POA: Diagnosis not present

## 2023-01-02 DIAGNOSIS — R296 Repeated falls: Secondary | ICD-10-CM | POA: Diagnosis not present

## 2023-01-02 DIAGNOSIS — G9389 Other specified disorders of brain: Secondary | ICD-10-CM

## 2023-01-02 LAB — LIPID PANEL
Cholesterol: 88 mg/dL (ref 0–200)
HDL: 38.4 mg/dL — ABNORMAL LOW (ref >39.00)
LDL Cholesterol: 30 mg/dL (ref 0–99)
NonHDL: 50.04
Total CHOL/HDL Ratio: 2
Triglycerides: 102 mg/dL (ref 0.0–149.0)
VLDL: 20.4 mg/dL (ref 0.0–40.0)

## 2023-01-02 LAB — CBC WITH DIFFERENTIAL/PLATELET
Basophils Absolute: 0.1 10*3/uL (ref 0.0–0.1)
Basophils Relative: 0.9 % (ref 0.0–3.0)
Eosinophils Absolute: 0.2 10*3/uL (ref 0.0–0.7)
Eosinophils Relative: 2.9 % (ref 0.0–5.0)
HCT: 37.6 % (ref 36.0–46.0)
Hemoglobin: 12.2 g/dL (ref 12.0–15.0)
Lymphocytes Relative: 19.8 % (ref 12.0–46.0)
Lymphs Abs: 1.1 10*3/uL (ref 0.7–4.0)
MCHC: 32.4 g/dL (ref 30.0–36.0)
MCV: 99.4 fL (ref 78.0–100.0)
Monocytes Absolute: 0.5 10*3/uL (ref 0.1–1.0)
Monocytes Relative: 9.9 % (ref 3.0–12.0)
Neutro Abs: 3.6 10*3/uL (ref 1.4–7.7)
Neutrophils Relative %: 66.5 % (ref 43.0–77.0)
Platelets: 83 10*3/uL — ABNORMAL LOW (ref 150.0–400.0)
RBC: 3.78 Mil/uL — ABNORMAL LOW (ref 3.87–5.11)
RDW: 16.6 % — ABNORMAL HIGH (ref 11.5–15.5)
WBC: 5.4 10*3/uL (ref 4.0–10.5)

## 2023-01-02 LAB — COMPREHENSIVE METABOLIC PANEL
ALT: 28 U/L (ref 0–35)
AST: 35 U/L (ref 0–37)
Albumin: 4.3 g/dL (ref 3.5–5.2)
Alkaline Phosphatase: 101 U/L (ref 39–117)
BUN: 25 mg/dL — ABNORMAL HIGH (ref 6–23)
CO2: 28 meq/L (ref 19–32)
Calcium: 10.3 mg/dL (ref 8.4–10.5)
Chloride: 106 meq/L (ref 96–112)
Creatinine, Ser: 0.97 mg/dL (ref 0.40–1.20)
GFR: 58.93 mL/min — ABNORMAL LOW (ref >60.00)
Glucose, Bld: 121 mg/dL — ABNORMAL HIGH (ref 70–99)
Potassium: 4.8 meq/L (ref 3.5–5.1)
Sodium: 144 meq/L (ref 135–145)
Total Bilirubin: 0.8 mg/dL (ref 0.2–1.2)
Total Protein: 6.6 g/dL (ref 6.0–8.3)

## 2023-01-02 LAB — POC URINALSYSI DIPSTICK (AUTOMATED)
Bilirubin, UA: NEGATIVE
Glucose, UA: POSITIVE — AB
Ketones, UA: NEGATIVE
Nitrite, UA: POSITIVE
Protein, UA: POSITIVE — AB
Spec Grav, UA: 1.02 (ref 1.010–1.025)
Urobilinogen, UA: 0.2 U/dL
pH, UA: 5.5 (ref 5.0–8.0)

## 2023-01-02 LAB — POCT GLYCOSYLATED HEMOGLOBIN (HGB A1C)
HbA1c POC (<> result, manual entry): 6.3 % (ref 4.0–5.6)
HbA1c, POC (controlled diabetic range): 6.3 % (ref 0.0–7.0)
HbA1c, POC (prediabetic range): 6.3 % (ref 5.7–6.4)
Hemoglobin A1C: 6.3 % — AB (ref 4.0–5.6)

## 2023-01-02 LAB — B12 AND FOLATE PANEL
Folate: >24.2 ng/mL (ref >5.9)
Vitamin B-12: 404 pg/mL (ref 211–911)

## 2023-01-02 LAB — TSH: TSH: 1.15 u[IU]/mL (ref 0.35–5.50)

## 2023-01-02 MED ORDER — NITROFURANTOIN MONOHYD MACRO 100 MG PO CAPS
100.0000 mg | ORAL_CAPSULE | Freq: Two times a day (BID) | ORAL | 0 refills | Status: AC
Start: 2023-01-02 — End: 2023-01-07

## 2023-01-02 MED ORDER — MIRABEGRON ER 25 MG PO TB24
25.0000 mg | ORAL_TABLET | Freq: Every day | ORAL | 0 refills | Status: DC
Start: 2023-01-02 — End: 2023-01-28

## 2023-01-02 NOTE — Assessment & Plan Note (Signed)
History of liver damage identified on CT scan (from 2023), with no history of alcohol consumption. No prior evaluation or hepatitis screening. -Order blood work for hepatitis screening. -Referral to a specialist for further evaluation of liver condition.

## 2023-01-02 NOTE — Assessment & Plan Note (Signed)
Multifactorial - referral to home health PT/OT for stability, gait training, assistive devices and strengthening

## 2023-01-02 NOTE — Patient Instructions (Addendum)
Stop your oxybutynin, Jardiance, and Remeron.  Please start taking the nitrofurantoin which is an antibiotic.  Take this twice daily until gone, a total of 5 days. We will only call with your urine culture should it require a change in antibiotics.  PLEASE increase your water intake extensively.  Please try to cut back on sweet tea and Kool-Aid.  Your A1c today is 6.3% keep up the good work with your blood sugar.  Please continue your metformin as previously prescribed.  We will order home health physical therapy and Occupational Therapy.  I am referring you to a neurologist to further assess your encephalomalacia and possible seizure history to see if lamictal is still indicated.  Please schedule a two week follow up visit.

## 2023-01-02 NOTE — Assessment & Plan Note (Signed)
Current use of Metformin and Jardiance for diabetes management. Heather Luna is expensive and potentially contributing to recurrent UTIs. -Order A1c to assess current blood sugar control. - in office today 6.3% -Discontinue Jardiance due to cost and potential contribution to UTIs. -Consider increasing Metformin dose before adding a second medication if A1C elevates upon next visit. Currently continue metformin 500mg  BID.

## 2023-01-02 NOTE — Assessment & Plan Note (Signed)
History of early onset stroke with subsequent encephalomalacia leading to increased risk of gait disturbance and falls. Current use of Agranox and Lipitor for stroke management. -Refer to neurology for evaluation of encephalomalacia and potential discontinuation of Lamictal if no recurrent seizures. -Continue Lipitor and agranox for stroke management.

## 2023-01-02 NOTE — Progress Notes (Signed)
New Patient Office Visit  Subjective:  Patient ID: Heather Luna, female    DOB: 29-Jan-1951  Age: 72 y.o. MRN: [id number]  CC:  Chief Complaint  Patient presents with   Establish Care    New pt get est. She has been having recurrent uti and has broken her arm last week.    HPI Heather Luna presents to establish care.  Pleasant 72yo female with a history of stroke (first one at age 82), cirrhosis, DM, and recurrent urinary tract infections (UTIs), presents after a recent fall. She has not seen a doctor in approximately two years, with her last interaction being with a home health nurse six months ago. Her medications have been managed by her primary care physician despite the lack of recent consultations.  The patient's history includes a significant fall several years ago, resulting in a broken foot and subsequent home health care. However, the frequency of these visits has decreased over time. The patient's caregiver has been monitoring her blood sugar levels at home, but no recent lab work has been conducted.  The patient's liver was reported to be significantly damaged, as per a CT scan conducted a year ago, with the caregiver suspecting the damage to be medication-induced. Despite multiple attempts to seek medical attention for this issue, no further investigations have been conducted. Pt denies any ETOH intake.  The patient has a history of recurrent UTIs, presenting with symptoms of urgency, burning, and odor. These UTIs occur approximately every other month and have been ongoing for several years. The patient's caregiver suspects that the UTIs may not be fully resolving due to potentially inappropriate antibiotic prescriptions. Pt admits to drinking little to no water throughout the day. She also has been on oxybutinin for several years due to urinary frequency. States she will get up every 1-2 hours at night to pee.  The patient also reports a history of seizures, with the  last episode occurring approximately ten years ago. This was evaluated in an ER while she was on vacation in [place]. She has been on Lamictal since the episode, with no further seizures reported.  The patient's caregiver has expressed concerns about the cost of Jardiance and its potential contribution to the recurrent UTIs. Pt has had DM for years, monitors sugars intermittently at home with good control noted. Takes metformin 500mg  BID, tolerating well with no issues.  The patient's sleep has been affected by recent events, and she is currently taking Remeron to manage this. The patient's caregiver has expressed concerns about the patient's increased risk of falls due to the sedative effects of this medication. Pt denies any issues with falling asleep and feels this medication is unnecessary.  The patient's history of stroke at an early age has not been fully evaluated, with the cause of the stroke remaining unknown. The patient's caregiver reports that the patient was not hypertensive at the time of the stroke. The patient's stroke history also includes a diagnosis of encephalomalacia, which may be contributing to her recurrent falls and potential memory impairment.  The patient's recent fall has not resulted in any significant changes to her health status. However, the caregiver is concerned about the potential for the patient's lightheadedness to have contributed to the fall.    Outpatient Encounter Medications as of 01/02/2023  Medication Sig   atorvastatin (LIPITOR) 40 MG tablet Take 40 mg by mouth at bedtime.   b complex vitamins tablet Take 1 tablet by mouth daily.   citalopram (CELEXA) 20  MG tablet Take 20 mg by mouth daily.   CRANBERRY PO Take 1 tablet by mouth daily.   dipyridamole-aspirin (AGGRENOX) 200-25 MG per 12 hr capsule Take 1 capsule by mouth 2 (two) times daily.   HYDROcodone-acetaminophen (NORCO/VICODIN) 5-325 MG tablet Take 1 tablet by mouth every 6 (six) hours as needed  for severe pain.   lamoTRIgine (LAMICTAL) 25 MG tablet Take 75 mg by mouth 2 (two) times daily.   metFORMIN (GLUCOPHAGE) 500 MG tablet Take 500 mg by mouth 2 (two) times daily.   mirabegron ER (MYRBETRIQ) 25 MG TB24 tablet Take 1 tablet (25 mg total) by mouth daily.   nitrofurantoin, macrocrystal-monohydrate, (MACROBID) 100 MG capsule Take 1 capsule (100 mg total) by mouth 2 (two) times daily for 5 days.   [DISCONTINUED] JARDIANCE 25 MG TABS tablet Take 25 mg by mouth daily.   [DISCONTINUED] mirtazapine (REMERON) 15 MG tablet Take 7.5 mg by mouth at bedtime.   [DISCONTINUED] oxybutynin (DITROPAN) 5 MG tablet Take 5 mg by mouth 2 (two) times daily.   No facility-administered encounter medications on file as of 01/02/2023.    Past Medical History:  Diagnosis Date   Anxiety    Arthritis    Cataract    Chronic UTI    Depression    Diabetes mellitus without complication (HCC)    Seizures (HCC)    Stroke [facility])     Past Surgical History:  Procedure Laterality Date   ABDOMINAL HYSTERECTOMY     APPENDECTOMY     BREAST SURGERY     left breast   CHOLECYSTECTOMY     EYE SURGERY     left cataract   FRACTURE SURGERY     R foot   MASTECTOMY Left 2007    Family History  Problem Relation Age of Onset   Rheum arthritis Mother    Cancer Mother    Heart failure Father    Heart attack Father    Breast cancer Paternal Grandmother     Social History   Socioeconomic History   Marital status: Widowed    Spouse name: Not on file   Number of children: Not on file   Years of education: Not on file   Highest education level: Not on file  Occupational History   Not on file  Tobacco Use   Smoking status: Never   Smokeless tobacco: Never  Vaping Use   Vaping status: Never Used  Substance and Sexual Activity   Alcohol use: Never   Drug use: Never   Sexual activity: Never  Other Topics Concern   Not on file  Social History Narrative   Not on file   Social Determinants of Health    Financial Resource Strain: Not on file  Food Insecurity: Not on file  Transportation Needs: Not on file  Physical Activity: Not on file  Stress: Not on file  Social Connections: Not on file  Intimate Partner Violence: Not on file    ROS: as noted in HPI  Objective:  BP 114/75   Pulse 68   Temp 98 F (36.7 C) (Oral)   Ht 5\' 3"  (1.6 m)   Wt 174 lb (78.9 kg)   SpO2 98%   BMI 30.82 kg/m   Physical Exam Vitals and nursing note reviewed. Exam conducted with a chaperone present (daughter).  Constitutional:      General: She is not in acute distress.    Appearance: Normal appearance. She is not ill-appearing, toxic-appearing or diaphoretic.  HENT:  Head: Normocephalic and atraumatic.     Right Ear: Tympanic membrane, ear canal and external ear normal. There is no impacted cerumen.     Left Ear: Tympanic membrane, ear canal and external ear normal. There is no impacted cerumen.     Nose: Nose normal.     Mouth/Throat:     Mouth: Mucous membranes are moist.     Pharynx: Oropharynx is clear. No oropharyngeal exudate or posterior oropharyngeal erythema.  Eyes:     General: No scleral icterus.       Right eye: No discharge.        Left eye: No discharge.     Extraocular Movements: Extraocular movements intact.     Pupils: Pupils are equal, round, and reactive to light.  Neck:     Thyroid: No thyroid mass, thyromegaly or thyroid tenderness.     Vascular: No carotid bruit.  Cardiovascular:     Rate and Rhythm: Normal rate and regular rhythm.     Pulses: Normal pulses.  Pulmonary:     Effort: Pulmonary effort is normal. No respiratory distress.     Breath sounds: Normal breath sounds. No stridor. No wheezing or rhonchi.  Abdominal:     General: Abdomen is flat. Bowel sounds are normal.     Palpations: Abdomen is soft. There is no mass.     Tenderness: There is no abdominal tenderness.  Musculoskeletal:        General: Deformity (L arm/hand contracture) present.      Cervical back: Neck supple. No rigidity or tenderness.     Right lower leg: No edema.     Left lower leg: No edema.     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  Skin:    General: Skin is warm and dry.     Coloration: Skin is pale.     Findings: No bruising, erythema or rash.  Neurological:     General: No focal deficit present.     Mental Status: She is alert and oriented to person, place, and time.     Sensory: No sensory deficit.     Motor: Weakness (chronic L sided weakness.) present.     Gait: Gait abnormal (slow, shuffling gait. Abnormal foot placement on L secondary to bracing).  Psychiatric:        Mood and Affect: Mood normal.        Behavior: Behavior normal.     Last hemoglobin A1c Lab Results  Component Value Date   HGBA1C 6.3 (A) 01/02/2023   HGBA1C 6.3 01/02/2023   HGBA1C 6.3 01/02/2023   HGBA1C 6.3 01/02/2023   Urinalysis    Component Value Date/Time   BILIRUBINUR negative 01/02/2023 0901   PROTEINUR Positive (A) 01/02/2023 0901   UROBILINOGEN 0.2 01/02/2023 0901   NITRITE positive 01/02/2023 0901   LEUKOCYTESUR Moderate (2+) (A) 01/02/2023 0901    Assessment & Plan:  Urinary frequency -     POCT Urinalysis Dipstick (Automated) -     Comprehensive metabolic panel -     CBC with Differential/Platelet -     TSH - Stop Oxybutinin, switch to myrbetriq -     Mirabegron ER; Take 1 tablet (25 mg total) by mouth daily.  Dispense: 30 tablet; Refill: 0 -     Nitrofurantoin Monohyd Macro; Take 1 capsule (100 mg total) by mouth  2 (two) times daily for 5 days.  Dispense: 10 capsule; Refill: 0 -     Urine Culture  Recurrent UTI Assessment & Plan: Frequent UTIs with symptoms of urgency, burning, itching, and odor. History of stroke with potential damage leading to incontinence. Current use of oxybutynin and Jardiance,  both of which could contribute to UTIs. -Collect urine sample for analysis which confirms current UTI. - Start macrobid -Discontinue Jardiance due to potential contribution to UTIs and high cost.   Orders: -     Comprehensive metabolic panel -     CBC with Differential/Platelet -     TSH -     Nitrofurantoin Monohyd Macro; Take 1 capsule (100 mg total) by mouth 2 (two) times daily for 5 days.  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 Current use of Agranox and Lipitor for stroke management. -Refer to neurology for evaluation of encephalomalacia and potential discontinuation of Lamictal if no recurrent seizures. -Continue Lipitor and agranox for stroke management.  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 [place] Pepper is expensive and potentially contributing to recurrent UTIs. -Order A1c to assess current blood sugar control. - in office today 6.3% -Discontinue Jardiance due to cost and potential contribution to UTIs. -Consider increasing Metformin dose before adding a second medication if A1C elevates upon next visit. Currently continue metformin 500mg  BID.   Orders: -     POCT glycosylated hemoglobin (Hb A1C) -     Comprehensive metabolic panel -     CBC with Differential/Platelet -      Lipid panel -     TSH -     B12 and Folate Panel  Depression, recurrent (HCC) Assessment & Plan: Long-term use of Celexa and mirtazapine for mood and sleep regulation. -Continue celexa as patient reports good sleep and mood control. -STOP mirtazapine due to no longer needed per pt and concern for sedation side effects/ falls  Orders: -     Comprehensive metabolic panel -     CBC with Differential/Platelet -     TSH -     B12 and Folate Panel  Hepatic cirrhosis, unspecified hepatic cirrhosis type, unspecified whether ascites present [facility]) Assessment & Plan: History of liver damage identified on CT scan (from 2023), with no history of alcohol consumption. No prior evaluation or hepatitis screening. -Order blood work for hepatitis screening. -Referral to a specialist for further evaluation of liver condition.  Orders: -     Comprehensive metabolic panel -     CBC with Differential/Platelet -     Lipid panel -     TSH -     Hepatitis panel, acute -     Ambulatory referral to Gastroenterology  Recurrent  falls Assessment & Plan: Multifactorial - referral to home health PT/OT for stability, gait training, assistive devices and strengthening  Orders: -     Comprehensive metabolic panel -     CBC with Differential/Platelet -     Lipid panel -     TSH -     B12 and Folate Panel -     Ambulatory referral to Home Health  Encephalomalacia with cerebral infarction [facility]) -     Ambulatory referral to Neurology  History of seizure disorder -     Ambulatory referral to Neurology   Total time face to face, writing note, reviewing chart notes and coordinating care >90 minutes.  Return in about 2 weeks (around 01/16/2023) for follow up.   Maretta Bees, PA

## 2023-01-02 NOTE — Assessment & Plan Note (Signed)
Frequent UTIs with symptoms of urgency, burning, itching, and odor. History of stroke with potential damage leading to incontinence. Current use of oxybutynin and Jardiance, both of which could contribute to UTIs. -Collect urine sample for analysis which confirms current UTI. - Start macrobid -Discontinue Jardiance due to potential contribution to UTIs and high cost.

## 2023-01-02 NOTE — Assessment & Plan Note (Signed)
Long-term use of Celexa and mirtazapine for mood and sleep regulation. -Continue celexa as patient reports good sleep and mood control. -STOP mirtazapine due to no longer needed per pt and concern for sedation side effects/ falls

## 2023-01-03 ENCOUNTER — Telehealth: Payer: Self-pay | Admitting: Urgent Care

## 2023-01-03 LAB — HEPATITIS PANEL, ACUTE
Hep A IgM: NONREACTIVE
Hep B C IgM: NONREACTIVE
Hepatitis B Surface Ag: NONREACTIVE
Hepatitis C Ab: NONREACTIVE

## 2023-01-03 NOTE — Telephone Encounter (Signed)
Thank you :)

## 2023-01-03 NOTE — Telephone Encounter (Signed)
Patient is aware of lab results. She did not have any additional questions or concerns at this time.

## 2023-01-04 LAB — URINE CULTURE
MICRO NUMBER:: 15603119
SPECIMEN QUALITY:: ADEQUATE

## 2023-01-09 ENCOUNTER — Encounter: Payer: Self-pay | Admitting: Neurology

## 2023-01-15 ENCOUNTER — Encounter: Payer: Self-pay | Admitting: Physician Assistant

## 2023-01-18 ENCOUNTER — Encounter: Payer: Self-pay | Admitting: Urgent Care

## 2023-01-18 ENCOUNTER — Ambulatory Visit (INDEPENDENT_AMBULATORY_CARE_PROVIDER_SITE_OTHER): Payer: Medicare HMO | Admitting: Urgent Care

## 2023-01-18 VITALS — BP 99/63 | HR 88 | Wt 175.0 lb

## 2023-01-18 DIAGNOSIS — Z23 Encounter for immunization: Secondary | ICD-10-CM

## 2023-01-18 DIAGNOSIS — E1149 Type 2 diabetes mellitus with other diabetic neurological complication: Secondary | ICD-10-CM

## 2023-01-18 DIAGNOSIS — N1831 Chronic kidney disease, stage 3a: Secondary | ICD-10-CM

## 2023-01-18 DIAGNOSIS — F339 Major depressive disorder, recurrent, unspecified: Secondary | ICD-10-CM

## 2023-01-18 DIAGNOSIS — K7460 Unspecified cirrhosis of liver: Secondary | ICD-10-CM

## 2023-01-18 DIAGNOSIS — I69352 Hemiplegia and hemiparesis following cerebral infarction affecting left dominant side: Secondary | ICD-10-CM

## 2023-01-18 DIAGNOSIS — Z1231 Encounter for screening mammogram for malignant neoplasm of breast: Secondary | ICD-10-CM

## 2023-01-18 DIAGNOSIS — N390 Urinary tract infection, site not specified: Secondary | ICD-10-CM | POA: Diagnosis not present

## 2023-01-18 DIAGNOSIS — Z7984 Long term (current) use of oral hypoglycemic drugs: Secondary | ICD-10-CM

## 2023-01-18 DIAGNOSIS — D696 Thrombocytopenia, unspecified: Secondary | ICD-10-CM | POA: Diagnosis not present

## 2023-01-18 DIAGNOSIS — G9389 Other specified disorders of brain: Secondary | ICD-10-CM

## 2023-01-18 LAB — MICROALBUMIN / CREATININE URINE RATIO
Creatinine,U: 99.1 mg/dL
Microalb Creat Ratio: 9.9 mg/g (ref 0.0–30.0)
Microalb, Ur: 9.8 mg/dL — ABNORMAL HIGH (ref 0.0–1.9)

## 2023-01-18 LAB — POCT URINALYSIS DIPSTICK
Bilirubin, UA: NEGATIVE
Blood, UA: POSITIVE
Glucose, UA: NEGATIVE
Ketones, UA: NEGATIVE
Nitrite, UA: POSITIVE
Protein, UA: POSITIVE — AB
Spec Grav, UA: 1.015 (ref 1.010–1.025)
Urobilinogen, UA: NEGATIVE U/dL — AB
pH, UA: 5 (ref 5.0–8.0)

## 2023-01-18 MED ORDER — AMOXICILLIN-POT CLAVULANATE 500-125 MG PO TABS
1.0000 | ORAL_TABLET | Freq: Two times a day (BID) | ORAL | 0 refills | Status: AC
Start: 2023-01-18 — End: 2023-01-25

## 2023-01-18 MED ORDER — ZOSTER VAC RECOMB ADJUVANTED 50 MCG/0.5ML IM SUSR
0.5000 mL | Freq: Once | INTRAMUSCULAR | 1 refills | Status: AC
Start: 2023-01-18 — End: 2023-01-18

## 2023-01-18 NOTE — Assessment & Plan Note (Signed)
Continue home health PT. 

## 2023-01-18 NOTE — Progress Notes (Signed)
Established Patient Office Visit  Subjective:  Patient ID: Heather Luna, female    DOB: [birth year]  Age: 72 y.o. MRN: [id number]  Chief Complaint  Patient presents with   Follow-up    2 week follow up. She has been in more pain and has been alternating tylenol and ibuprofen.     72 year old with a history of diabetes, hypertension, and a left mastectomy, presented for a follow-up consultation. The patient's primary concern was for potential liver cirrhosis, as indicated by a CT scan performed a year ago. The patient reported no significant gastrointestinal symptoms such as decreased appetite, nausea, or vomiting. However, occasional episodes of nausea were managed with Tums, providing relief.  The patient's blood work showed stable results, with some abnormalities noted. Liver function was normal despite the CT scan suggesting possible cirrhosis. Kidney function was slightly reduced, indicating chronic kidney disease stage three. The patient's platelet count was low, likely due to liver issues, but showed an improvement compared to the previous year. The patient reported no active bleeding or excessive bleeding episodes.  The patient's cholesterol levels were well-managed with atorvastatin, and thyroid function was normal. B12 levels were on the lower end, but the patient was taking a B complex supplement orally. The patient's A1c was 6.3, and home glucose monitoring showed levels around 120-130. The patient had stopped taking Jardiance, and metformin dosage remained unchanged.  The patient reported improved urinary symptoms after starting Myrbetriq. However, there was a concern about a possible urinary tract infection, and a urine sample was requested for further investigation. The patient had stopped taking prescribed pain medication (Norco) and was managing pain with over-the-counter Tylenol and ibuprofen.  The patient's blood pressure was noted to be low, and there were concerns about  dehydration. The patient admitted to not drinking enough fluids, which could potentially contribute to recurrent urinary tract infections and falls due to low blood pressure.  The patient had a history of a left mastectomy and was due for a mammogram. The patient also needed a diabetic eye exam and a urine microalbumin test. Vaccinations for flu, shingles, and pneumonia were discussed.  General Health Maintenance -Order screening mammogram. -Order urine microalbumin with today's urine sample. -Refer for diabetic retinopathy screening. -flu vaccination administer today -Administer shingles vaccine and pneumococcal vaccine on separate occasions due to potential side effects. -Schedule follow-up in 3 months.    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) 01/02/2023   Hepatic cirrhosis (HCC) 01/02/2023   Recurrent falls 01/02/2023   History of seizure disorder 01/02/2023   Encephalomalacia with cerebral infarction (HCC) 01/02/2023   Recurrent UTI 09/24/2013   Absence of bladder continence 04/23/2012   Closed fracture of phalanx of foot 02/20/2012   Epilepsy (HCC) 07/03/2011   CVA (cerebral vascular accident) (HCC) 07/26/2005   Past Medical History:  Diagnosis Date   Anxiety    Arthritis    Cataract    Chronic UTI    Depression    Diabetes mellitus without complication (HCC)    Seizures (HCC)    Stroke (HCC)    Social History   Tobacco Use   Smoking status: Never   Smokeless tobacco: Never  Vaping Use   Vaping status: Never Used  Substance Use Topics   Alcohol use: Never   Drug use: Never      ROS: as noted in HPI  Objective:     BP 99/63   Pulse 88   Wt 175 lb (79.4  kg)   SpO2 98%   BMI 31.00 kg/m  BP Readings from Last 3 Encounters:  01/18/23 99/63  01/02/23 114/75  12/30/22 (!) 99/55   Wt Readings from Last 3 Encounters:  01/18/23 175 lb (79.4 kg)  01/02/23 174 lb (78.9 kg)  12/30/22 200 lb (90.7 kg)      Physical Exam Vitals and nursing note reviewed. Exam conducted with a chaperone present (daughter).  Constitutional:      General: She is not in acute distress.    Appearance: Normal appearance. She is not ill-appearing, toxic-appearing or diaphoretic.  HENT:     Head: Normocephalic and atraumatic.     Right Ear: Tympanic membrane, ear canal and external ear normal. There is no impacted cerumen.     Left Ear: Tympanic membrane, ear canal and external ear normal. There is no impacted cerumen.     Nose: Nose normal.     Mouth/Throat:     Mouth: Mucous membranes are moist.     Pharynx: Oropharynx is clear. No oropharyngeal exudate or posterior oropharyngeal erythema.  Eyes:     General: No scleral icterus.       Right eye: No discharge.        Left eye: No discharge.     Extraocular Movements: Extraocular movements intact.     Pupils: Pupils are equal, round, and reactive to light.  Neck:     Thyroid: No thyroid mass, thyromegaly or thyroid tenderness.     Vascular: No carotid bruit.  Cardiovascular:     Rate and Rhythm: Normal rate and regular rhythm.     Pulses: Normal pulses.  Pulmonary:     Effort: Pulmonary effort is normal. No respiratory distress.     Breath sounds: Normal breath sounds. No stridor. No wheezing or rhonchi.  Abdominal:     General: Abdomen is flat. Bowel sounds are normal. There is no distension.     Palpations: Abdomen is soft. There is no mass.     Tenderness: There is no abdominal tenderness.  Musculoskeletal:        General: Deformity (L arm/hand contracture) present.     Cervical back: Neck supple. No rigidity or tenderness.     Right lower leg: No edema.     Left lower leg: No edema.     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  Skin:    General: Skin is warm and dry.     Coloration: Skin is pale.     Findings: No bruising, erythema or rash.  Neurological:     General: No focal deficit present.     Mental Status: She is alert and oriented to person, place, and time.     Sensory: No sensory deficit.     Motor: Weakness (chronic L sided weakness.) present.     Gait: Gait abnormal (slow, shuffling gait. Abnormal foot placement on L secondary to bracing).  Psychiatric:        Mood and Affect: Mood normal.        Behavior: Behavior normal.      No results found for  any visits on 01/18/23.  Last CBC Lab Results  Component Value Date   WBC 5.4 01/02/2023   HGB 12.2 01/02/2023   HCT 37.6 01/02/2023   MCV 99.4 01/02/2023   MCH 34.3 (H) 08/19/2021   RDW 16.6 (H) 01/02/2023   PLT 83.0 (L) 01/02/2023   Last metabolic panel Lab Results  Component Value Date   GLUCOSE 121 (H) 01/02/2023   NA 144 01/02/2023   K 4.8 01/02/2023   CL 106 01/02/2023   CO2 28 01/02/2023   BUN 25 (H) 01/02/2023   CREATININE 0.97 01/02/2023   GFR 58.93 (L) 01/02/2023   CALCIUM 10.3 01/02/2023   PROT 6.6 01/02/2023   ALBUMIN 4.3 01/02/2023   BILITOT 0.8 01/02/2023   ALKPHOS 101 01/02/2023   AST 35 01/02/2023   ALT 28 01/02/2023   ANIONGAP 8 08/19/2021   Last lipids Lab Results  Component Value Date   CHOL 88 01/02/2023   HDL 38.40 (L) 01/02/2023   LDLCALC 30 01/02/2023   TRIG 102.0 01/02/2023   CHOLHDL 2 01/02/2023   Last hemoglobin A1c Lab Results  Component Value Date   HGBA1C 6.3 (A) 01/02/2023   HGBA1C 6.3 01/02/2023   HGBA1C 6.3 01/02/2023   HGBA1C 6.3 01/02/2023   Last vitamin B12 and Folate Lab Results  Component Value Date   VITAMINB12 404 01/02/2023   FOLATE >24.2 01/02/2023      The ASCVD Risk score (Arnett DK, et  al., 2019) failed to calculate for the following reasons:   The patient has a prior MI or stroke diagnosis  Assessment & Plan:  Thrombocytopenia (HCC)  Hepatic cirrhosis, unspecified hepatic cirrhosis type, unspecified whether ascites present [facility]) Assessment & Plan: Liver Cirrhosis Stable liver function tests despite CT scan suggesting cirrhosis. Thrombocytopenia likely secondary to liver disease. No active bleeding or signs of significant coagulopathy. -Pt has appointment with gastroenterology the beginning of January 2025, is on the wait list for earlier appointment if possible   Type 2 diabetes mellitus with neurological complications (HCC) Assessment & Plan: A1C 6.3, home glucose readings around 120-130. Currently on Metformin 500mg  twice daily. -Continue current management. Recheck A1C in 3 months.  Orders: -     Microalbumin / creatinine urine ratio -     Ambulatory referral to Ophthalmology  Encephalomalacia with cerebral infarction [facility]) Assessment & Plan: Neurology appointment currently scheduled for 02/13/23   Recurrent UTI Assessment & Plan: Pt completed macrobid last visit.  Currently on Myrbetriq, tolerating with decreased nightly episodes of urination noted. UA today, concerning for continued UTI. Urine culture ordered; start Augmentin 500mg  BID x 7 days Increase WATER intake  Orders: -     POCT urinalysis dipstick -     Urine Culture -     Amoxicillin-Pot Clavulanate; Take 1 tablet by mouth 2 (two) times daily with a meal for 7 days.  Dispense: 14 tablet; Refill: 0  Depression, recurrent (HCC) Assessment & Plan: Stable, doing well on celexa. 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 Encouraged adequate hydration. Avoid nephrotoxic medications.   Need for  influenza vaccination -     Flu Vaccine Trivalent High Dose (Fluad)  Screening mammogram for breast cancer -     3D Screening Mammogram, Left and Right; Future  Need for shingles vaccine -     Zoster Vac  Recomb Adjuvanted; Inject 0.5 mLs into the muscle once for 1 dose. Repeat 2nd dose in 2-6 months  Dispense: 0.5 mL; Refill: 1     Return in about 3 months (around 04/20/2023).   Maretta Bees, PA

## 2023-01-18 NOTE — Assessment & Plan Note (Signed)
Stable, doing well on celexa. continue

## 2023-01-18 NOTE — Assessment & Plan Note (Addendum)
Liver Cirrhosis Stable liver function tests despite CT scan suggesting cirrhosis. Thrombocytopenia likely secondary to liver disease. No active bleeding or signs of significant coagulopathy. -Pt has appointment with gastroenterology the beginning of January 2025, is on the wait list for earlier appointment if possible

## 2023-01-18 NOTE — Patient Instructions (Addendum)
Your labs look good overall.  Please go and get the shingrix vaccine sometime next week. Please call and schedule an appointment for a diabetic eye screening exam.  Continue with your medications as ordered. Start Augmentin 500mg  BID with food x 7 days. Will call with your urine culture results if a change in antibiotics is indicated.  We will see you back in 3 months, sooner if needed.

## 2023-01-18 NOTE — Assessment & Plan Note (Signed)
Stable kidney function with GFR 58. Encouraged adequate hydration. Avoid nephrotoxic medications.

## 2023-01-18 NOTE — Assessment & Plan Note (Addendum)
Pt completed macrobid last visit.  Currently on Myrbetriq, tolerating with decreased nightly episodes of urination noted. UA today, concerning for continued UTI. Urine culture ordered; start Augmentin 500mg  BID x 7 days Increase WATER intake

## 2023-01-18 NOTE — Assessment & Plan Note (Signed)
A1C 6.3, home glucose readings around 120-130. Currently on Metformin 500mg  twice daily. -Continue current management. Recheck A1C in 3 months.

## 2023-01-18 NOTE — Assessment & Plan Note (Signed)
Neurology appointment currently scheduled for 02/13/23

## 2023-01-20 LAB — URINE CULTURE
MICRO NUMBER:: 15676639
SPECIMEN QUALITY:: ADEQUATE

## 2023-01-22 ENCOUNTER — Telehealth: Payer: Self-pay

## 2023-01-22 NOTE — Telephone Encounter (Signed)
Left Melissa a VM with information

## 2023-01-22 NOTE — Telephone Encounter (Signed)
Caller/Agency: Efraim Kaufmann from [facility] Callback Number: [phone]  Requesting OT/PT/Skilled Nursing/Social Work/Speech Therapy: PT  Frequency: 1x weekly for 6 weeks  Request for skilled nurse evaluation  Request for speech therapy evaluation  Referral for Cardiology

## 2023-01-28 ENCOUNTER — Other Ambulatory Visit: Payer: Self-pay

## 2023-01-28 ENCOUNTER — Telehealth: Payer: Self-pay

## 2023-01-28 DIAGNOSIS — R350 Frequency of micturition: Secondary | ICD-10-CM

## 2023-01-28 MED ORDER — MIRABEGRON ER 25 MG PO TB24: 25.0000 mg | ORAL_TABLET | Freq: Every day | ORAL | 0 refills | Status: DC

## 2023-01-28 NOTE — Telephone Encounter (Signed)
[facility] Day - Client Nonclinical Telephone Record  [facility] Client [facility] Day - Client Client Site [facility] - Day Provider AA - PHYSICIAN, NOT LISTED- MD Contact Type Call Who Is Calling Home Health / Hospice Agency Caller Name [facility] Phone Number Declined to provide Patient Name Heather Luna Patient DOB 04/28/1950 Facility Number [phone] Facility Name [facility] Call Type Home Care Hospice Message Only Reason for Call Request to speak to Physician Initial Comment Almira Coaster, calling from [facility]. She is requesting speech therapy orders for a mutual patient. The patient sees PA [employer]. She needs to reschedule ST assessment due to schedule conflict with patient. Disp. Time Disposition Final User 01/28/2023 12:57:03 PM General Information Provided Yes Brooke Pace Call Closed By: Brooke Pace Transaction Date/Time: 01/28/2023 12:51:34 PM (ET)

## 2023-02-04 NOTE — Progress Notes (Signed)
Initial neurology clinic note  Heather Luna MRN: [id number] DOB: [birth year]  Referring provider: Maretta Bees, PA  Primary care provider: Maretta Luna, [place]  Reason for consult:  History of stroke and seizure  Subjective:  This is [name], a 72 y.o. left-handed female with a medical history of stroke (1995 and 2007, right hemisphere) c/b seizure and left sided spasticity, DM, HLD, breast cancer, cirrhosis c/b thrombocytopenia who presents to neurology clinic with history of stroke and seizure. The patient is accompanied by Heather Luna, daughter.  Patient had a stroke in 1995. She has previously been seen by neurology at [facility] in 2019. 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 Her most recent MRI of the brain showed expected evolution of right ACA, right MCA, and bilateral (L>R) cerebellar/PICA infarcts. The most recent MRA head showed occlusion of the right A2 segment of the ACA, abnormalities of the right distal ACA. She also has a history of complex partial seizures for which she takes lamotrigine. She is referred for questions regarding her antiplatelet therapy. One of her strokes occurred while she was taking aspirin and clopidogrel, and she has been maintained on aspirin-dipyridamole ever since.   Medical co-morbidities noted to be contributing to the patient's stroke risk include the following: hyperlipidemia, prior strokes  Interval history:  Obtained history from: patient Patient caretaker: n/a (daughter helps out) [name] presents to [facility] today, accompanied by her daughter. During today's encounter, the the following history is added:   She endorses a history of what she describes as TIAs, most recently  in 2012 with unusual behaviors (playing with dish towels, for instance), but none in the last 4-5 years. Regarding her antiplatelet medication, she was initially taking clopidogrel after her stroke in 1995, and then was taken off this medication for about 10 days or so prior to her surgery. After the second stroke she was started on Aggrenox, which she continues to take. However, the branded drug has become too expensive and the question is whether should she should switch to the generic brand.   The patient is interested in restarting PT. 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 She wears a brace due to foot drop on that side. She endorses pain at night in the left ankle and foot, as if someone were pulling on it.   At one point patient was evaluated in a [place] ED and started on Lamictal due to concern for seizures. Patient was found in the kitchen confused by her sister. She had EEG and per patient, they thought it was a "mini-stroke". Per patient Lamictal was started in [place] when she returned. Patient has been on this for about 10 years. She is on 75 mg twice daily. There has been no concern for seizure since.   Daughter has some concerns about her mothers decision making and safety. She has spent money she does not have. She has significant weakness and spasticity of her left side. She has frequent falls, about 1 time per week. It usually happens when she is bending over and will lose her balance. She denies numbness, tingling, or pain.  She is currently on aggrenox 25-200 mg twice daily and atorvastatin 40 mg daily. Of note, on B complex.  MEDICATIONS:  Outpatient Encounter Medications as of 02/13/2023  Medication Sig   atorvastatin (LIPITOR) 40 MG tablet Take 40 mg by mouth at bedtime.   b complex vitamins tablet Take 1 tablet by mouth daily.   cephALEXin (KEFLEX)  250 MG capsule Take 1 capsule (250 mg total) by mouth daily.   citalopram (CELEXA) 20 MG tablet Take 20 mg by mouth daily.   CRANBERRY PO Take 1 tablet by mouth daily.   dipyridamole-aspirin (AGGRENOX) 200-25 MG per 12 hr capsule Take 1 capsule by mouth 2 (two) times daily.   lamoTRIgine (LAMICTAL) 25 MG tablet Take 75 mg by mouth 2 (two) times daily.   metFORMIN (GLUCOPHAGE) 500 MG tablet Take 500 mg by mouth 2 (two) times daily.   mirabegron ER (MYRBETRIQ) 25 MG TB24 tablet Take 1 tablet (25 mg total) by mouth daily.   nitrofurantoin, macrocrystal-monohydrate, (MACROBID) 100 MG capsule Take 1 capsule (100 mg total) by mouth 2 (two) times daily for 7 days.   No facility-administered encounter medications on file as of 02/13/2023.    PAST MEDICAL HISTORY: Past Medical History:  Diagnosis Date   Anxiety    Arthritis    Cataract    Chronic UTI    Depression    Diabetes mellitus without complication (HCC)    Seizures (HCC)    Stroke (HCC)     PAST SURGICAL HISTORY: Past Surgical History:  Procedure Laterality Date   ABDOMINAL HYSTERECTOMY     APPENDECTOMY     BREAST SURGERY     left breast   CHOLECYSTECTOMY     EYE SURGERY     left cataract   FRACTURE SURGERY     R foot   MASTECTOMY Left 2007    ALLERGIES: Allergies  Allergen Reactions   Septra [Sulfamethoxazole-Trimethoprim] Hives   Levetiracetam Other (See Comments)    Crying and irritabliity    FAMILY HISTORY: Family History  Problem Relation Age of Onset   Rheum arthritis Mother    Cancer Mother    Heart failure Father    Heart attack Father    Breast cancer Paternal Grandmother     SOCIAL HISTORY: Social History   Tobacco Use   Smoking status: Never   Smokeless tobacco: Never  Vaping Use   Vaping status: Never Used  Substance Use Topics   Alcohol use: Never   Drug use: Never   Social History   Social History Narrative   Are you right handed or left handed? Left handed but is now right handed    Are you currently employed ?    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    What type of home do you live in: 1 story or 2 story? one    Caffeine 1/2 cup coffee a day    Objective:  Vital Signs:  BP 128/74   Pulse 72   Ht 5\' 2"  (1.575 m)   SpO2 96%   BMI 32.01 kg/m   General: No acute distress.  Patient appears well-groomed.   Head:  Normocephalic/atraumatic Neck: supple, no paraspinal tenderness, full range of motion Heart: regular rate and rhythm Lungs: Clear to auscultation bilaterally. Vascular: No carotid bruits.  Neurological Exam: Mental status: alert and oriented, speech fluent and not dysarthric, language intact.  Cranial nerves: CN I: not tested CN  II: pupils equal, round and reactive to light, visual fields intact CN III, IV, VI:  full range of motion, no nystagmus, no ptosis. Difficulty looking to left, but able to with effort CN V: facial sensation intact. CN VII: upper and lower face symmetric CN VIII: hearing intact CN IX, X: uvula midline CN XI: sternocleidomastoid and trapezius muscles intact CN XII: tongue midline  Bulk & Tone: normal, no fasciculations. Motor:  muscle strength 5/5 in RUE and RLE. 0/5 in LUE. LLE: 5/5 with hip flexion, extension, knee flexion and extension, 1/5 in DF and PF. Deep Tendon Reflexes:  2+ in upper extremities; 3+ in bilateral patella. Sensation:  Pinprick sensation intact diminished in LUE and LLE Gait:  Slow, unsteady, walks with a cane and has leg brace on LLE for foot drop.   Labs and Imaging review: Internal labs: Lab Results  Component Value Date   HGBA1C 6.3 (A) 01/02/2023   HGBA1C 6.3 01/02/2023   HGBA1C 6.3 01/02/2023   HGBA1C 6.3 01/02/2023   Lab Results  Component Value Date   VITAMINB12 404 01/02/2023   Lab Results  Component Value Date   TSH 1.15 01/02/2023   01/02/23: CBC w/ diff significant for thrombocytopenia (platelets 83 -  chronic) CMP significant for glucose of 121 Folate wnl Lipid panel: Component     Latest Ref Rng 01/02/2023  Cholesterol     0 - 200 mg/dL 88   Triglycerides     0.0 - 149.0 mg/dL 478.2   HDL Cholesterol     >39.00 mg/dL 95.62 (L)   VLDL     0.0 - 40.0 mg/dL 13.0   LDL (calc)     0 - 99 mg/dL 30   Total CHOL/HDL Ratio 2   NonHDL 50.04      Imaging: CT head and cervical spine wo contrast (12/30/22): FINDINGS: CT HEAD FINDINGS   Brain: No acute hemorrhage. 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 Unchanged old perforator infarcts in the left caudate, right thalamus and bilateral cerebellar hemispheres. No hydrocephalus or extra-axial collection. No mass effect or midline shift.   Vascular: No hyperdense vessel or unexpected calcification.   Skull: No calvarial fracture or suspicious bone lesion. Skull base is unremarkable.   Sinuses/Orbits: No acute finding.   Other: None.   CT CERVICAL SPINE FINDINGS   Alignment: Unchanged 3 mm degenerative anterolisthesis of C4 on C5. No traumatic malalignment.   Skull base and vertebrae: No acute fracture. Normal craniocervical junction. No suspicious bone lesions.   Soft tissues and spinal canal: No prevertebral fluid or swelling. No visible canal hematoma.   Disc levels: Unchanged multilevel cervical spondylosis without high-grade spinal canal stenosis.   Upper chest: No acute findings.   Other: None.   IMPRESSION: 1. No acute intracranial abnormality. 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 2. No acute cervical spine fracture or traumatic malalignment.  CT head wo contrast (08/13/2018): IMPRESSION: 1. No acute intracranial abnormality detected. 2. Left posterior scalp swelling without evidence of a calvarial defect. 3. Large old right frontal lobe infarct. Probable old infarct involving the left  cerebellum.  Assessment/Plan:  INESHA PINHEIRO is a 72 y.o. female who presents for history of stroke c/b left sided weakness, numbness, and spasticity, possible history of seizures, and imbalance and falls. She has a relevant medical history of stroke (1995 and 2007, right hemisphere) c/b seizure and left sided spasticity, DM, HLD, breast cancer, cirrhosis c/b thrombocytopenia. 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 Available diagnostic data is significant for CT head showing extensive encephalomalacia in right ACA and MCA territories 2/2 prior infarct. Her left sided weakness, numbness, and spasticity are residuals from her prior stroke. We discussed treatment for spasticity including medication (such as baclofen) or botox. I am concerned a medication like baclofen may cause more generalized weakness and could contribute to her imbalance. Botox would likely be a better option. Patient would prefer to complete more PT and then decide if botox is needed.   In terms of her seizure history, this is unclear to me. Her previous neurologist mentions a history of partial seizures, but patient only recalls one episode of confusion in the past. She has been on Lamictal for ~10 years without concern for additional episodes. We agreed to get EEG then discuss whether tapering Lamictal makes sense.  PLAN: -1 hour EEG -Continue Lamictal 75 mg BID for now -Continue Aggrenox -Continue atorvastatin 40 mg daily -Continue PT/OT for home health; will send a new referral if needed. Patient to discuss with PT -Discussed botox for spasticity, patient would like to continue try more PT first  -Return to clinic in 6 months  The impression above as well as the plan as outlined below were extensively discussed with the patient (in the company of daughter) who voiced understanding. All questions were answered to their  satisfaction.  The patient was counseled on pertinent fall precautions per the printed material provided today, and as noted under the "Patient Instructions" section below.  When available, results of the above investigations and possible further recommendations will be communicated to the patient via telephone/MyChart. Patient to call office if not contacted after expected testing turnaround time.   Total time spent reviewing records, interview, history/exam, documentation, and coordination of care on day of encounter:  50 min   Thank you for allowing me to participate in patient's care.  If I can answer any additional questions, I would be pleased to do so.  Jacquelyne Balint, MD   CC: [place], [place] [address] [place] [address]  CC: Referring provider: Maretta Luna, [place] [address] [place],  [place] [address]

## 2023-02-06 ENCOUNTER — Telehealth: Payer: Self-pay | Admitting: Urgent Care

## 2023-02-06 NOTE — Telephone Encounter (Signed)
[facility] Faxed  DME order request , to be filled out by provider. Patient requested to send it back via Fax within ASAP. Document is located in providers tray at front office.Please advise at Mobile [phone] (mobile)

## 2023-02-06 NOTE — Telephone Encounter (Signed)
This is confirmation of verbal approval

## 2023-02-06 NOTE — Telephone Encounter (Signed)
Caller/Agency: [facility] Callback Number: [phone] Requesting OT/PT/Skilled Nursing/Social Work/Speech Therapy: ST next week 1 week 1. Almira Coaster called to get verbal orders  Frequency: 1 week 1

## 2023-02-07 ENCOUNTER — Other Ambulatory Visit: Payer: Self-pay

## 2023-02-07 DIAGNOSIS — N390 Urinary tract infection, site not specified: Secondary | ICD-10-CM

## 2023-02-07 NOTE — Telephone Encounter (Signed)
 Form received and placed on Heather Luna's desk

## 2023-02-07 NOTE — Telephone Encounter (Signed)
Forms were signed and completed. May this also serve as verbal authorization if needed

## 2023-02-08 ENCOUNTER — Other Ambulatory Visit (INDEPENDENT_AMBULATORY_CARE_PROVIDER_SITE_OTHER): Payer: Medicare HMO

## 2023-02-08 DIAGNOSIS — N390 Urinary tract infection, site not specified: Secondary | ICD-10-CM | POA: Diagnosis not present

## 2023-02-08 LAB — POC URINALSYSI DIPSTICK (AUTOMATED)
Bilirubin, UA: NEGATIVE
Blood, UA: NEGATIVE
Glucose, UA: NEGATIVE
Ketones, UA: NEGATIVE
Nitrite, UA: NEGATIVE
Protein, UA: POSITIVE — AB
Spec Grav, UA: 1.025 (ref 1.010–1.025)
Urobilinogen, UA: 0.2 U/dL — AB
pH, UA: 5.5 (ref 5.0–8.0)

## 2023-02-08 MED ORDER — CEPHALEXIN 250 MG PO CAPS: 250.0000 mg | ORAL_CAPSULE | Freq: Every day | ORAL | 0 refills | Status: DC

## 2023-02-10 LAB — URINE CULTURE
MICRO NUMBER:: 15768986
SPECIMEN QUALITY:: ADEQUATE

## 2023-02-11 ENCOUNTER — Telehealth: Payer: Self-pay | Admitting: Urgent Care

## 2023-02-11 ENCOUNTER — Other Ambulatory Visit: Payer: Self-pay | Admitting: Urgent Care

## 2023-02-11 DIAGNOSIS — N390 Urinary tract infection, site not specified: Secondary | ICD-10-CM

## 2023-02-11 MED ORDER — NITROFURANTOIN MONOHYD MACRO 100 MG PO CAPS
100.0000 mg | ORAL_CAPSULE | Freq: Two times a day (BID) | ORAL | 0 refills | Status: AC
Start: 2023-02-11 — End: 2023-02-18

## 2023-02-11 NOTE — Telephone Encounter (Signed)
Okay thank you

## 2023-02-11 NOTE — Telephone Encounter (Signed)
[facility] faxed  , to be filled out by provider. Patient requested to send it back via Fax within ASAP. Document is located in providers tray at front office.Please advise at Mobile [phone] (mobile)

## 2023-02-11 NOTE — Telephone Encounter (Signed)
Heather Luna with [facility] called to inform us that Heather Luna daughter and herself are declining the ST eval, but will continue with other Home Health services.

## 2023-02-12 ENCOUNTER — Telehealth: Payer: Self-pay | Admitting: Urgent Care

## 2023-02-12 NOTE — Telephone Encounter (Signed)
Caller/Agency: [facility]  Callback Number: [phone] Requesting OT/PT/Skilled Nursing/Social Work/Speech Therapy: OT Frequency: 1 time for 1 week  2 times a week for 1 week 1 time a week for 1 week.

## 2023-02-12 NOTE — Telephone Encounter (Signed)
Completed.  Thank you!

## 2023-02-12 NOTE — Telephone Encounter (Signed)
What I signed this morning stated that Speech Therapy was refused by patient. Therefore they can have everything else other than ST.

## 2023-02-13 ENCOUNTER — Encounter: Payer: Self-pay | Admitting: Neurology

## 2023-02-13 ENCOUNTER — Ambulatory Visit: Payer: Medicare HMO | Admitting: Neurology

## 2023-02-13 VITALS — BP 128/74 | HR 72 | Ht 62.0 in

## 2023-02-13 DIAGNOSIS — Z8669 Personal history of other diseases of the nervous system and sense organs: Secondary | ICD-10-CM

## 2023-02-13 DIAGNOSIS — E1149 Type 2 diabetes mellitus with other diabetic neurological complication: Secondary | ICD-10-CM

## 2023-02-13 DIAGNOSIS — I639 Cerebral infarction, unspecified: Secondary | ICD-10-CM | POA: Diagnosis not present

## 2023-02-13 DIAGNOSIS — I69352 Hemiplegia and hemiparesis following cerebral infarction affecting left dominant side: Secondary | ICD-10-CM | POA: Diagnosis not present

## 2023-02-13 DIAGNOSIS — R296 Repeated falls: Secondary | ICD-10-CM | POA: Diagnosis not present

## 2023-02-13 NOTE — Patient Instructions (Addendum)
We will get brain wave testing (EEG) to determine if it is safe to consider stopping your Lamictal.  I want you to continue your Lamictal for now (75 mg twice daily).  For stroke prevention, continue Aggrenox and atorvastatin.  I want you to do as much physical therapy as possible. Continue your current therapy and let me know if you need a new order for more.  We discussed the possibility of botox for the stiffness of your left arm and leg (spasticity). We will hold off on this for now, but can always revisit it.  I will be in touch when I have the results of your EEG. Please let me know if you have any questions or concerns in the meantime.   The physicians and staff at [facility] are committed to providing excellent care. You may receive a survey requesting feedback about your experience at our office. We strive to receive "very good" responses to the survey questions. If you feel that your experience would prevent you from giving the office a "very good " response, please contact our office to try to remedy the situation. We may be reached at [phone]. Thank you for taking the time out of your busy day to complete the survey.  Jacquelyne Balint, MD [facility]  Preventing Falls at [facility] are common, often dreaded events in the lives of older people. Aside from the obvious injuries and even death that may result, fall can cause wide-ranging consequences including loss of independence, mental decline, decreased activity and mobility. Younger people are also at risk of falling, especially those with chronic illnesses and fatigue.  Ways to reduce risk for falling Examine diet and medications. Warm foods and alcohol dilate blood vessels, which can lead to dizziness when standing. Sleep aids, antidepressants and pain medications can also increase the likelihood of a fall.  Get a vision exam. Poor vision, cataracts and glaucoma increase the chances of falling.  Check foot gear.  Shoes should fit snugly and have a sturdy, nonskid sole and a broad, low heel  Participate in a physician-approved exercise program to build and maintain muscle strength and improve balance and coordination. Programs that use ankle weights or stretch bands are excellent for muscle-strengthening. Water aerobics programs and low-impact Tai Chi programs have also been shown to improve balance and coordination.  Increase vitamin D intake. Vitamin D improves muscle strength and increases the amount of calcium the body is able to absorb and deposit in bones.  How to prevent falls from common hazards Floors - Remove all loose wires, cords, and throw rugs. Minimize clutter. Make sure rugs are anchored and smooth. Keep furniture in its usual place.  Chairs -- Use chairs with straight backs, armrests and firm seats. Add firm cushions to existing pieces to add height.  Bathroom - Install grab bars and non-skid tape in the tub or shower. Use a bathtub transfer bench or a shower chair with a back support Use an elevated toilet seat and/or safety rails to assist standing from a low surface. Do not use towel racks or bathroom tissue holders to help you stand.  Lighting - Make sure halls, stairways, and entrances are well-lit. Install a night light in your bathroom or hallway. Make sure there is a light switch at the top and bottom of the staircase. Turn lights on if you get up in the middle of the night. Make sure lamps or light switches are within reach of the bed if you have to get  up during the night.  Kitchen - Install non-skid rubber mats near the sink and stove. Clean spills immediately. Store frequently used utensils, pots, pans between waist and eye level. This helps prevent reaching and bending. Sit when getting things out of lower cupboards.  Living room/ Bedrooms - Place furniture with wide spaces in between, giving enough room to move around. Establish a route through the living room that gives you  something to hold onto as you walk.  Stairs - Make sure treads, rails, and rugs are secure. Install a rail on both sides of the stairs. If stairs are a threat, it might be helpful to arrange most of your activities on the lower level to reduce the number of times you must climb the stairs.  Entrances and doorways - Install metal handles on the walls adjacent to the doorknobs of all doors to make it more secure as you travel through the doorway.  Tips for maintaining balance Keep at least one hand free at all times. Try using a backpack or fanny pack to hold things rather than carrying them in your hands. Never carry objects in both hands when walking as this interferes with keeping your balance.  Attempt to swing both arms from front to back while walking. This might require a conscious effort if Parkinson's disease has diminished your movement. It will, however, help you to maintain balance and posture, and reduce fatigue.  Consciously lift your feet off of the ground when walking. Shuffling and dragging of the feet is a common culprit in losing your balance.  When trying to navigate turns, use a "U" technique of facing forward and making a wide turn, rather than pivoting sharply.  Try to stand with your feet shoulder-length apart. When your feet are close together for any length of time, you increase your risk of losing your balance and falling.  Do one thing at a time. Don't try to walk and accomplish another task, such as reading or looking around. The decrease in your automatic reflexes complicates motor function, so the less distraction, the better.  Do not wear rubber or gripping soled shoes, they might "catch" on the floor and cause tripping.  Move slowly when changing positions. Use deliberate, concentrated movements and, if needed, use a grab bar or walking aid. Count 15 seconds between each movement. For example, when rising from a seated position, wait 15 seconds after standing to  begin walking.  If balance is a continuous problem, you might want to consider a walking aid such as a cane, walking stick, or walker. Once you've mastered walking with help, you might be ready to try it on your own again.

## 2023-02-18 ENCOUNTER — Telehealth: Payer: Self-pay | Admitting: Urgent Care

## 2023-02-18 NOTE — Telephone Encounter (Signed)
 Form received and placed on Heather Luna's desk

## 2023-02-18 NOTE — Telephone Encounter (Signed)
[facility] Faxed orders, to be filled out by provider. Patient requested to send it back via Fax within ASAP. Document is located in providers tray at front office.Please advise at Mobile [phone] (mobile)

## 2023-02-19 NOTE — Telephone Encounter (Signed)
Signed and returned to front desk. Thank you

## 2023-02-21 ENCOUNTER — Telehealth (HOSPITAL_BASED_OUTPATIENT_CLINIC_OR_DEPARTMENT_OTHER): Payer: Self-pay | Admitting: Urgent Care

## 2023-02-22 ENCOUNTER — Ambulatory Visit: Payer: Medicare HMO | Admitting: Neurology

## 2023-02-22 DIAGNOSIS — G9389 Other specified disorders of brain: Secondary | ICD-10-CM | POA: Diagnosis not present

## 2023-02-22 DIAGNOSIS — Z8669 Personal history of other diseases of the nervous system and sense organs: Secondary | ICD-10-CM | POA: Diagnosis not present

## 2023-02-22 NOTE — Progress Notes (Signed)
EEG complete - results pending 

## 2023-02-25 ENCOUNTER — Telehealth: Payer: Self-pay | Admitting: Urgent Care

## 2023-02-25 NOTE — Telephone Encounter (Signed)
[facility] faxed a couple forms regarding HHST eval and MSW eval performed to be filled out by provider. Patient requested to send it back via Fax within 7-days. Document is located in providers tray at front office.Please advise at Mobile [phone] (mobile)

## 2023-02-26 NOTE — Telephone Encounter (Signed)
Form placed on Heather Luna's desk for review

## 2023-02-28 ENCOUNTER — Telehealth: Payer: Self-pay | Admitting: Urgent Care

## 2023-02-28 NOTE — Telephone Encounter (Signed)
[facility] faxed orders , to be filled out by provider. Patient requested to send it back via Fax within 7-days. Document is located in providers tray at front office.Please advise at Mobile [phone] (mobile)

## 2023-03-01 ENCOUNTER — Encounter: Payer: Self-pay | Admitting: Neurology

## 2023-03-01 NOTE — Telephone Encounter (Signed)
Form received and given to [facility]

## 2023-03-01 NOTE — Procedures (Signed)
ELECTROENCEPHALOGRAM REPORT  Date of Study: 02/22/2023  Patient's Name: Heather Luna MRN: [id number] Date of Birth: [birth year]  Referring Provider: Dr. Jacquelyne Balint  Clinical History: This is a 72 year old woman with history of right hemispheric stroke and seizures, seizure-free for ~10 years, EEG to help guide long-term management.  Seizures Medications: Lamotrigine  Technical Summary: A multichannel digital 1-hour EEG recording measured by the international 10-20 system with electrodes applied with paste and impedances below 5000 ohms performed in our laboratory with EKG monitoring in an awake and asleep patient.  Hyperventilation was not performed. Photic stimulation was performed.  The digital EEG was referentially recorded, reformatted, and digitally filtered in a variety of bipolar and referential montages for optimal display.    Description: The patient is awake and asleep during the recording.  During maximal wakefulness, there is a symmetric, medium voltage 9 Hz posterior dominant rhythm that attenuates with eye opening.  There is continuous polymorphic theta and delta slowing over the right hemisphere, maximal over the right frontocentrotemporal regions, at times sharply contoured without clear epileptogenic potential. During drowsiness and sleep, there is an increase in theta slowing of the background. Vertex waves and symmetric sleep spindles were seen. Photic stimulation did not elicit any abnormalities.  There were no clear epileptiform discharges or electrographic seizures seen.    EKG lead was unremarkable.  Impression: This 1-hour awake and asleep EEG is abnormal due to focal slowing over the right hemisphere.  Clinical Correlation of the above findings indicates focal cerebral dysfunction over the right hemisphere consistent with prior stroke in this region. The absence of epileptiform discharges does not exclude a clinical diagnosis of epilepsy. Clinical correlation is  advised.    Patrcia Dolly, M.D.

## 2023-03-04 ENCOUNTER — Telehealth: Payer: Self-pay | Admitting: Urgent Care

## 2023-03-04 NOTE — Telephone Encounter (Signed)
Mindi Junker with [facility] called to extend visit until after the Christmas holiday starting 12-30 one time a week for 3 weeks.

## 2023-03-05 ENCOUNTER — Telehealth: Payer: Self-pay | Admitting: Urgent Care

## 2023-03-05 DIAGNOSIS — G43909 Migraine, unspecified, not intractable, without status migrainosus: Secondary | ICD-10-CM | POA: Insufficient documentation

## 2023-03-05 NOTE — Telephone Encounter (Signed)
[facility] Faxed form , to be filled out by provider. Patient requested to send it back via Fax within 7-days. Document is located in providers tray at front office.Please advise at Mobile [phone] (mobile)

## 2023-03-05 NOTE — Telephone Encounter (Signed)
Verbal authorization to approve this. Thank you

## 2023-03-08 ENCOUNTER — Inpatient Hospital Stay (HOSPITAL_BASED_OUTPATIENT_CLINIC_OR_DEPARTMENT_OTHER): Admission: RE | Admit: 2023-03-08 | Payer: Medicare HMO | Source: Ambulatory Visit | Admitting: Radiology

## 2023-03-21 ENCOUNTER — Ambulatory Visit (INDEPENDENT_AMBULATORY_CARE_PROVIDER_SITE_OTHER): Payer: Self-pay | Admitting: Physician Assistant

## 2023-03-21 ENCOUNTER — Other Ambulatory Visit (INDEPENDENT_AMBULATORY_CARE_PROVIDER_SITE_OTHER): Payer: Self-pay

## 2023-03-21 ENCOUNTER — Telehealth: Payer: Self-pay | Admitting: *Deleted

## 2023-03-21 ENCOUNTER — Encounter: Payer: Self-pay | Admitting: Physician Assistant

## 2023-03-21 VITALS — BP 112/70 | HR 78 | Ht 62.0 in | Wt 150.0 lb

## 2023-03-21 DIAGNOSIS — K7460 Unspecified cirrhosis of liver: Secondary | ICD-10-CM

## 2023-03-21 DIAGNOSIS — D696 Thrombocytopenia, unspecified: Secondary | ICD-10-CM | POA: Diagnosis not present

## 2023-03-21 DIAGNOSIS — K766 Portal hypertension: Secondary | ICD-10-CM

## 2023-03-21 DIAGNOSIS — Z860101 Personal history of adenomatous and serrated colon polyps: Secondary | ICD-10-CM

## 2023-03-21 DIAGNOSIS — E1149 Type 2 diabetes mellitus with other diabetic neurological complication: Secondary | ICD-10-CM

## 2023-03-21 DIAGNOSIS — I6930 Unspecified sequelae of cerebral infarction: Secondary | ICD-10-CM | POA: Diagnosis not present

## 2023-03-21 DIAGNOSIS — G40909 Epilepsy, unspecified, not intractable, without status epilepticus: Secondary | ICD-10-CM

## 2023-03-21 DIAGNOSIS — I69352 Hemiplegia and hemiparesis following cerebral infarction affecting left dominant side: Secondary | ICD-10-CM

## 2023-03-21 DIAGNOSIS — Z7984 Long term (current) use of oral hypoglycemic drugs: Secondary | ICD-10-CM

## 2023-03-21 LAB — CBC WITH DIFFERENTIAL/PLATELET
Basophils Absolute: 0 10*3/uL (ref 0.0–0.1)
Basophils Relative: 0.8 % (ref 0.0–3.0)
Eosinophils Absolute: 0.1 10*3/uL (ref 0.0–0.7)
Eosinophils Relative: 2.1 % (ref 0.0–5.0)
HCT: 37.8 % (ref 36.0–46.0)
Hemoglobin: 12.6 g/dL (ref 12.0–15.0)
Lymphocytes Relative: 14.7 % (ref 12.0–46.0)
Lymphs Abs: 0.8 10*3/uL (ref 0.7–4.0)
MCHC: 33.3 g/dL (ref 30.0–36.0)
MCV: 95.8 fL (ref 78.0–100.0)
Monocytes Absolute: 0.4 10*3/uL (ref 0.1–1.0)
Monocytes Relative: 7.5 % (ref 3.0–12.0)
Neutro Abs: 4.3 10*3/uL (ref 1.4–7.7)
Neutrophils Relative %: 74.9 % (ref 43.0–77.0)
Platelets: 105 10*3/uL — ABNORMAL LOW (ref 150.0–400.0)
RBC: 3.94 Mil/uL (ref 3.87–5.11)
RDW: 16.1 % — ABNORMAL HIGH (ref 11.5–15.5)
WBC: 5.8 10*3/uL (ref 4.0–10.5)

## 2023-03-21 LAB — COMPREHENSIVE METABOLIC PANEL
ALT: 18 U/L (ref 0–35)
AST: 25 U/L (ref 0–37)
Albumin: 4.4 g/dL (ref 3.5–5.2)
Alkaline Phosphatase: 92 U/L (ref 39–117)
BUN: 18 mg/dL (ref 6–23)
CO2: 24 meq/L (ref 19–32)
Calcium: 9.6 mg/dL (ref 8.4–10.5)
Chloride: 108 meq/L (ref 96–112)
Creatinine, Ser: 0.77 mg/dL (ref 0.40–1.20)
GFR: 77.63 mL/min (ref >60.00)
Glucose, Bld: 101 mg/dL — ABNORMAL HIGH (ref 70–99)
Potassium: 4.1 meq/L (ref 3.5–5.1)
Sodium: 141 meq/L (ref 135–145)
Total Bilirubin: 0.8 mg/dL (ref 0.2–1.2)
Total Protein: 7 g/dL (ref 6.0–8.3)

## 2023-03-21 LAB — AMMONIA: Ammonia: 50 umol/L — ABNORMAL HIGH (ref 11–35)

## 2023-03-21 LAB — PROTIME-INR
INR: 1.1 {ratio} — ABNORMAL HIGH (ref 0.8–1.0)
Prothrombin Time: 11.7 s (ref 9.6–13.1)

## 2023-03-21 NOTE — Progress Notes (Signed)
 03/21/2023 Heather Luna [id number] [birth year]  Referring provider: Maretta Bees, PA Primary GI doctor: Dr. Myrtie Neither  ASSESSMENT AND PLAN:     Cirrhosis Noted on previous CT scan 2023. 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 Noted potential signs of hepatic encephalopathy with imbalance and confusion, portal HTN seen on CT, and thrombocytopenia. - will get work up to rule out autoimmune, though likely MASH - will get hepatitis panel and evaluate for screening -Order labs including INR and ammonia levels. -Schedule an ultrasound for liver cancer screening. -Will plan on EGD in the hospital with colonoscopy to evaluate for portal HTN Will schedule EGD. I discussed risks of EGD with patient today, including risk of sedation, bleeding or perforation.  Patient provides understanding and gave verbal consent to proceed.  ADDENDUM: - patient called and canceled procedure 06/07/2023  Colon Polyps History of large polyps (one 18mm) removed during previous colonoscopy. -Schedule colonoscopy for further evaluation, will schedule at the hospital as patient had a difficult time getting on the table. We have discussed the risks of bleeding, infection, perforation, medication reactions, and remote risk of death associated with colonoscopy. All questions were answered and the patient acknowledges these risk and wishes to proceed.  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 -Consult with neurology regarding perioperative anticoagulation management for upcoming endoscopy and colonoscopy.  Type 2 diabetes mellitus with neurological complications (HCC) Will need to hold metformin  Nonintractable epilepsy without status epilepticus, unspecified  epilepsy type (HCC) Follows Dr. Karel Jarvis  Follow up 6 months Patient Care Team: Maretta Bees, [place] as PCP - General (Physician Assistant)  HISTORY OF PRESENT ILLNESS: 73 y.o. female with a past medical history of QTc prolongation, subdural hemorrhage 2015, epilepsy, history of CVA in 1995, 2007 with left-sided spasticity on aggrenox, recurrent UTI on keflex once daily, breast cancer status post left mastectomy, DM 2, hyperlipidemia, status post cholecystectomy and others listed below presents for evaluation of cirrhosis with thrombocytopenia and evidence of portal hypertension.   2018 colonoscopy at [facility] due to positive fit test -Nonthrombosed external hemorrhoids and perianal skin tag, internal hemorrhoids -7 mm polyp transverse colon, 8 mm polyp splenic flexure, 3 polyps 6 to 8 mm descending colon -10 mm polyp descending colon -11 mm polyp sigmoid colon -Diverticulosis sigmoid colon transverse and ascending -18 mm polyp rectum Pathology was tubular adenomatous polyps without high-grade dysplasia recall 1 year  08/19/2021 CT abdomen pelvis with contrast for abdominal pain showed inferior right hepatic lobe nodular contour, recount denies periumbilical vein and ventral abdominal varices no liver lesion pancreas unremarkable, showed splenomegaly with largely normal splenic enhancement benign-appearing hemangioma redundant sigmoid colon with diverticulosis ventral omental varices upper abdomen retained stool no bowel inflammation decompressed terminal ileum decompressed stomach and duodenum no dilated small bowel  Negative hep panel 12/2022 Platelets 83  History of Present Illness   The patient, with a history of multiple polyps, one of which was 18mm, presents with concerns about her liver health. She reports no new symptoms or changes in health since her last visit. 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 She has not been previously informed of this diagnosis.  The patient also has a history of diabetes,  cholesterol, and blood pressure issues, which are known risk factors for the progression of MASH to cirrhosis. She reports no symptoms of hepatic encephalopathy such as imbalance, slowness, or confusion. However, she does have a history of stroke, which may contribute to some of these symptoms.  The patient also reports occasional blood in the stool, which she attributes to hemorrhoids. She has not noticed any changes in this symptom over the years. She also reports a history of hernia surgery, during which a patch was placed, and she expresses concerns about a potential recall on this patch. She wonders if this could be contributing to her urinary tract issues.  The patient denies any history of alcohol consumption. She is currently on anticoagulant therapy with Agrinox. She reports no new medications or changes in her medication regimen. She also reports no changes in her bowel habits, with bowel movements described as "pretty normal." She denies any recent nausea, vomiting, heartburn, or trouble swallowing.     She  reports that she has never smoked. She has never used smokeless tobacco. She reports that she does not drink alcohol and does not use drugs.  RELEVANT LABS AND IMAGING:  Results   LABS PLT: Thrombocytopenia  RADIOLOGY CT Abdomen: Nodular liver, cirrhosis likely  DIAGNOSTIC Colonoscopy: Eight polyps removed, one 18mm      CBC    Component Value Date/Time   WBC 5.4 01/02/2023 0914   RBC 3.78 (L) 01/02/2023 0914   HGB 12.2 01/02/2023 0914   HCT 37.6 01/02/2023 0914   PLT 83.0 (L) 01/02/2023 0914   MCV 99.4 01/02/2023 0914   MCH 34.3 (H) 08/19/2021 0829   MCHC 32.4 01/02/2023 0914   RDW 16.6 (H) 01/02/2023 0914   LYMPHSABS 1.1 01/02/2023 0914   MONOABS 0.5 01/02/2023 0914   EOSABS 0.2 01/02/2023 0914   BASOSABS 0.1  01/02/2023 0914   Recent Labs    01/02/23 0914  HGB 12.2    CMP     Component Value Date/Time   NA 144 01/02/2023 0914   K 4.8 01/02/2023 0914   CL 106 01/02/2023 0914   CO2 28 01/02/2023 0914   GLUCOSE 121 (H) 01/02/2023 0914   BUN 25 (H) 01/02/2023 0914   CREATININE 0.97 01/02/2023 0914   CALCIUM 10.3 01/02/2023 0914   PROT 6.6 01/02/2023 0914   ALBUMIN 4.3 01/02/2023 0914   AST 35 01/02/2023 0914   ALT 28 01/02/2023 0914   ALKPHOS 101 01/02/2023 0914   BILITOT 0.8 01/02/2023 0914   GFRNONAA 59 (L) 08/19/2021 0700      Latest Ref Rng & Units 01/02/2023    9:14 AM  Hepatic Function  Total Protein 6.0 - 8.3 g/dL 6.6   Albumin 3.5 - 5.2 g/dL 4.3   AST 0 - 37 U/L 35   ALT 0 - 35 U/L 28   Alk Phosphatase 39 - 117 U/L 101   Total Bilirubin 0.2 - 1.2 mg/dL 0.8       Current Medications:   Current Outpatient Medications (Endocrine & Metabolic):    metFORMIN (GLUCOPHAGE) 500 MG tablet, Take 500 mg by mouth 2 (two) times daily.  Current Outpatient Medications (Cardiovascular):    atorvastatin (LIPITOR) 40 MG tablet, Take 40 mg by mouth at bedtime.    Current Outpatient Medications (Hematological):    dipyridamole-aspirin (AGGRENOX) 200-25 MG per 12 hr capsule, Take 1 capsule by mouth 2 (two) times daily.  Current Outpatient Medications (Other):    b complex vitamins tablet, Take 1 tablet by mouth  daily.   cephALEXin (KEFLEX) 250 MG capsule, Take 1 capsule (250 mg total) by mouth daily.   citalopram (CELEXA) 20 MG tablet, Take 20 mg by mouth daily.   CRANBERRY PO, Take 1 tablet by mouth daily.   lamoTRIgine (LAMICTAL) 25 MG tablet, Take 75 mg by mouth 2 (two) times daily.   mirabegron ER (MYRBETRIQ) 25 MG TB24 tablet, Take 1 tablet (25 mg total) by mouth daily.  Medical History:  Past Medical History:  Diagnosis Date   Anxiety    Arthritis    Cataract    Chronic UTI    Depression    Diabetes mellitus without complication (HCC)    Seizures (HCC)    Stroke  (HCC)    Allergies:  Allergies  Allergen Reactions   Septra [Sulfamethoxazole-Trimethoprim] Hives   Levetiracetam Other (See Comments)    Crying and irritabliity     Surgical History:  She  has a past surgical history that includes Appendectomy; Breast surgery; Cholecystectomy; Eye surgery; Abdominal hysterectomy; Fracture surgery; and Mastectomy (Left, 2007). Family History:  Her family history includes Breast cancer in her paternal grandmother; Cancer in her mother; Heart attack in her father; Heart failure in her father; Rheum arthritis in her mother.  REVIEW OF SYSTEMS  : All other systems reviewed and negative except where noted in the History of Present Illness.  PHYSICAL EXAM: BP 112/70   Pulse 78   Ht 5\' 2"  (1.575 m)   Wt 150 lb (68 kg) Comment: pt has a problem  with foot my not be accurate  BMI 27.44 kg/m  General :  Alert, well developed female in no acute distress Head:  Normocephalic and atraumatic. Eyes :  sclerae anicteric,conjunctive pink  Heart:  regular rate and rhythm, no murmurs or gallops Pulm:  Clear anteriorly; no wheezing Abdomen:   Soft, Obese AB, skin exam normal, Normal bowel sounds.  no  tenderness . , without hepatomegaly. no  fluid wave, no  shifting dullness.  Extremities:   Without edema. Msk:  Symmetrical without gross deformities. Peripheral pulses intact. Walks with cane, difficult to get on table.  Neurologic: Alert and  oriented x4;  walks with cane, spastic left hand/left leg in a brace, with asterixis mild right hand versus from stroke.  Skin:   without jaundice. no palmar erythema or spider angioma.   Psychiatric:  Demonstrates good judgement and reason without abnormal affect or behaviors.  Doree Albee, PA-C 11:06 AM

## 2023-03-21 NOTE — Telephone Encounter (Signed)
 Alan, Please see the note from Neurology about patients aggrenox , what do you think?   ===View-only below this line===     ----- Message ----- From: Leigh Venetia CROME, MD Sent: 03/21/2023  12:43 PM EST To: Grayce CHRISTELLA Loge, CMA  I think this message is to ask if it is okay for patient to hold Aggrenox  for endoscopy. She is on this for secondary stroke prevention. Her last stroke was in 2007. Obviously, stopping the medication does increase the risk of stroke, but given the distant nature of last stroke, benefits likely outweigh the risks. This should be discussed with patient as a part of informed consent if decision is made that she should stop the medication.  Let me know if you have any further questions or concerns.  Best,  Venetia Leigh, MD [facility] ----- Message ----- From: Loge Grayce CHRISTELLA, CMA Sent: 03/21/2023  12:23 PM EST To: Venetia CROME Leigh, MD

## 2023-03-21 NOTE — Telephone Encounter (Signed)
     Request for surgical clearance:     Endoscopy Procedure  What type of surgery is being performed?     Colonoscopy/Endoscopy  When is this surgery scheduled?     TBD  What type of clearance is required ?   Pharmacy  Are there any medications that need to be held prior to surgery and how long? Aggrenox   How Long?   Practice name and name of physician performing surgery?      [facility]  What is your office phone and fax number?      Phone- [phone]  Fax- [phone]  Anesthesia type (None, local, MAC, general) ?       MAC   Please route your response to [employer]

## 2023-03-21 NOTE — Patient Instructions (Addendum)
 Your provider has requested that you go to the basement level for lab work before leaving today. Press B on the elevator. The lab is located at the first door on the left as you exit the elevator.  TYLENOL  (ACETAMINOPHEN ) IS SAFE IN LIVER DISEASE: You can take tylenol  (acetaminophen ) up to 2,000 mg/day. This would be four extra strength (500mg ) tablets over 24 hours OR six regular strength (325 mg) tablets over 24 hours. Please be sure to read the ingredients of over the counter medications and prescription pain medications as many contain acetaminophen .   NO NSAIDS (ibuprofen, advil, naproxen, aleve, motrin...)  HEPATIC ENCEPHALOPATHY HEPATIC ENCEPHALOPATHY: Confusion caused by a build up of toxins in the blood due to the liver not being able to filter toxins. This can cause confusion mild or severe, increase falls. If you have been diagnosed with Hepatic encephalopathy, advised to not drive due to increased risk  - if her ammonia is elevated we will start on a medication to help with this.   IF YOU ARE VERY SLEEPY, HARD FOR YOUR FAMILY TO WAKE YOU UP, OR FALLING ASLEEP DURING CONVERSATIONS -INCREASE LACTULOSE /GO TO THE EMERGENCY ROO  PHYSICAL ACTIVITY It is important to continue to be active when you have cirrhosis. Exercise will help reduce muscle loss and weakness.  DISCUSS REFERRAL FOR PHYSICAL THERAPY WITH YOUR PRIMARY CARE PROVIDER  DIET/NUTRITION FOR CIRRHOSIS NO ALCOHOL YOUR GOALS Evening snack - high protein Supplements between meals to help meet calorie and protein goal: Boost Ensure Premier Protein Shakes Protein Greek yogurt Fish, chicken (NO RAW OR UNDERCOOKED FISH/SHELLFISH) Avoid pork and red meat Plant based protein (non-soy)/Vegan: Lentils, Chickpeas, Peanuts (non salted), almonds (non salted), quinoa, chia seeds  Plant based protein supplements (not soy)  Avoid/limit animal based protein supplements: whey, casein 4.  Low sodium (2,000 mg/day) A. Avoid: table  salt, canned foods, deli meats, sausages, hot dogs, anything with a long shelf life B. Read nutrition labels and be aware of serving size. Don't go by percent of daily value.  We will be in contact with you about scheduling your colonoscopy/Endoscopy ay [facility]  Due to recent changes in healthcare laws, you may see the results of your imaging and laboratory studies on MyChart before your provider has had a chance to review them.  We understand that in some cases there may be results that are confusing or concerning to you. Not all laboratory results come back in the same time frame and the provider may be waiting for multiple results in order to interpret others.  Please give us  48 hours in order for your provider to thoroughly review all the results before contacting the office for clarification of your results.    I appreciate the  opportunity to care for you  Thank You   [facility]

## 2023-03-22 ENCOUNTER — Other Ambulatory Visit: Payer: Self-pay

## 2023-03-22 DIAGNOSIS — K7460 Unspecified cirrhosis of liver: Secondary | ICD-10-CM

## 2023-03-22 DIAGNOSIS — K766 Portal hypertension: Secondary | ICD-10-CM

## 2023-03-22 MED ORDER — LACTULOSE 10 GM/15ML PO SOLN: ORAL | 1 refills | Status: AC

## 2023-03-22 NOTE — Progress Notes (Signed)
 ____________________________________________________________  Attending physician addendum:  Thank you for sending this case to me. I have reviewed the entire note and agree with the plan.  Excellent comprehensive review on a complex patient. Cirrhosis with CT evidence of significant portal hypertension.  Trace perihepatic ascites on CT scan done 18 months ago. Synthetic function fortunately preserved with INR 1.1, and mildly elevated ammonia level of 50 could indicate hepatic encephalopathy as a contributing factor to the post CVA symptoms that you note.  And she is long overdue for colon polyp surveillance.  In addition to the above, I recommend the following:  Patient's primary care provider should seriously reconsider the use of metformin  in this patient due to the risk of metabolic acidosis and cirrhosis. She needs an alpha-fetoprotein as well as a complete abdominal ultrasound with Doppler studies of the hepatic and portal vessels to screen for [facility] and evaluate for any ascites that may be present and be sure there is no portal or hepatic vein thrombosis.  If it is feasible to do so given the patient's mobility issues, I would start her on low-dose lactulose  such as 15 mL twice daily.  Victory Brand, MD  ____________________________________________________________

## 2023-03-22 NOTE — Progress Notes (Signed)
 Spoke with ultrasound at [facility] and was informed an order needs to be entered for an [place] abd complete and another order for Liver doppler entered. Both entered and rad scheduling to contact pt regarding appts.

## 2023-03-22 NOTE — Addendum Note (Signed)
 Addended by: Quentin Mulling on: 03/22/2023 12:33 PM   Modules accepted: Orders

## 2023-03-25 LAB — HEPATITIS A ANTIBODY, TOTAL: Hepatitis A AB,Total: NONREACTIVE

## 2023-03-25 LAB — HEPATITIS C ANTIBODY: Hepatitis C Ab: NONREACTIVE

## 2023-03-25 LAB — HEPATITIS B SURFACE ANTIBODY,QUALITATIVE: Hep B S Ab: NONREACTIVE

## 2023-03-25 LAB — IGA: Immunoglobulin A: 291 mg/dL (ref 70–320)

## 2023-03-25 LAB — MITOCHONDRIAL ANTIBODIES: Mitochondrial M2 Ab, IgG: <=20 U (ref <=20.0)

## 2023-03-25 LAB — TISSUE TRANSGLUTAMINASE, IGA: (tTG) Ab, IgA: <1 U/mL

## 2023-03-25 LAB — ANA: Anti Nuclear Antibody (ANA): NEGATIVE

## 2023-03-25 LAB — HEPATITIS B SURFACE ANTIGEN: Hepatitis B Surface Ag: NONREACTIVE

## 2023-03-25 LAB — AFP TUMOR MARKER: AFP-Tumor Marker: 2.3 ng/mL

## 2023-03-25 LAB — ANTI-SMOOTH MUSCLE ANTIBODY, IGG: Actin (Smooth Muscle) Antibody (IGG): <20 U (ref <20)

## 2023-03-29 ENCOUNTER — Telehealth: Payer: Self-pay

## 2023-03-29 NOTE — Telephone Encounter (Signed)
 approved

## 2023-03-29 NOTE — Telephone Encounter (Signed)
 Copied from CRM [id number]. Topic: Clinical - Home Health Verbal Orders >> Mar 29, 2023 10:58 AM Robinson DEL wrote: Caller/Agency: Stephanie/[facility] Callback Number: [phone] Service Requested: Occupational Therapy Frequency: 1x week for 4 weeks 1x week every other for 2 visits Any new concerns about the patient? No

## 2023-04-04 NOTE — Telephone Encounter (Signed)
[place], Do you know when the best time I can schedule this patient for a colon/EGD at [facility] for Danis?  Thanks     Patient has clearance for aggrenox see phone note. Per Quentin Mulling ok to schedule colon/Endo , this has to be a [facility] procedure with Dr Myrtie Neither  I will get with his nurse and see when the best time to schedule for him will be    ===View-only below this line=== ----- Message ----- From: Javier Glazier Sent: 03/21/2023   4:16 PM EST To: Marlowe Kays, CMA  We discussed about it, they are saying it is okay for her to come off of it. I'm assuming they are fine with it and we can take that as a clearance.  Thanks! Marchelle Folks ----- Message ----- From: Marlowe Kays, CMA Sent: 03/21/2023   2:07 PM EST To: Doree Albee, PA-C

## 2023-04-04 NOTE — Telephone Encounter (Signed)
Home health orders received 04/01/23 for Heather Luna Home health initiation orders: No.  Home health re-certification orders: Yes. Patient last seen by ordering physician for this condition: 01/18/23. Must be less than 90 days for re-certification and less than 30 days prior for initiation. Visit must have been for the condition the orders are being placed.  Patient meets criteria for Physician to sign orders: Yes.        Current med list has been attached: Yes        Orders placed on physicians desk for signature: 04/04/23 (date) If patient does not meet criteria for orders to be signed: pt was called to schedule appt. Appt is scheduled for n/a.   Filomena Jungling

## 2023-04-08 NOTE — Telephone Encounter (Addendum)
Home health orders received 04/08/23 for [name] Home health initiation orders: No.  Home health re-certification orders: Yes. Patient last seen by ordering physician for this condition: 01/18/23. Must be less than 90 days for re-certification and less than 30 days prior for initiation. Visit must have been for the condition the orders are being placed.  Patient meets criteria for Physician to sign orders: Yes.        Current med list has been attached: Yes        Orders placed on physicians desk for signature: 04/08/23 (date) If patient does not meet criteria for orders to be signed: pt was called to schedule appt. Appt is scheduled for n/a.    Heather Luna

## 2023-04-08 NOTE — Telephone Encounter (Addendum)
Zella Ball, 06/17/23 is Dr. Myrtie Neither' next available hospital date ([facility]). I will check with Dr. Myrtie Neither to see if patient can be added on.

## 2023-04-08 NOTE — Telephone Encounter (Signed)
Signed/ completed. Thank you

## 2023-04-08 NOTE — Telephone Encounter (Signed)
Thank you for the note.  Yes, she can be scheduled for procedures with me at [facility] on 06/17/2023.   H Danis

## 2023-04-09 ENCOUNTER — Encounter: Payer: Self-pay | Admitting: *Deleted

## 2023-04-09 ENCOUNTER — Other Ambulatory Visit: Payer: Self-pay | Admitting: *Deleted

## 2023-04-09 DIAGNOSIS — Z860101 Personal history of adenomatous and serrated colon polyps: Secondary | ICD-10-CM

## 2023-04-09 DIAGNOSIS — E1149 Type 2 diabetes mellitus with other diabetic neurological complication: Secondary | ICD-10-CM

## 2023-04-09 DIAGNOSIS — K7460 Unspecified cirrhosis of liver: Secondary | ICD-10-CM

## 2023-04-09 MED ORDER — SUFLAVE 178.7 G PO SOLR: 1.0000 | Freq: Once | ORAL | 0 refills | Status: AC

## 2023-04-09 NOTE — Telephone Encounter (Signed)
Forms completed and faxed.

## 2023-04-09 NOTE — Telephone Encounter (Signed)
Scheduled patient for colonoscopy/endoscopy on 06/17/2023 at 9:07 am at [facility]. Will inform patient and mail her out instructions    Left message for patient that we have scheduled her EGD/Colon and I will mail her out instructions. Call the office if that date and time does not work for you

## 2023-04-09 NOTE — Telephone Encounter (Signed)
You will receive your bowel preparation through [facility], which ensures the lowest copay and home delivery, with outreach via text or call from an [phone] number. Please respond promptly to avoid rescheduling. If you are interested in alternative options or have any questions please contact them at [phone]  Your Provider Has Sent Your Bowel Prep Regimen To [facility] What to expect. [facility] will contact you to verify your information and collect your copay, if applicable. Enjoy the comfort of your home while we deliver your prescription to you, free of any shipping charges. Fast, FREE delivery or shipping. [facility] accepts all major insurance benefits and applies discounts & coupons  Have additional questions? [facility]'s patient care team is always here to help.  Chat: [email or web address] Call: [phone] Email: [email or web address] NCPDP: [id number] How will we contact you? Welcome Phone call  a Welcome text and a Checkout link in a text Texts you receive from [phone] Are Not Spam.   *To set up delivery, you must complete the checkout process via link or speak to one of our patient care representatives. If we are unable to reach you, your prescription may be delayed.

## 2023-04-09 NOTE — Addendum Note (Signed)
Addended by: Marlowe Kays on: 04/09/2023 03:32 PM   Modules accepted: Orders

## 2023-04-11 LAB — HM DIABETES EYE EXAM

## 2023-04-16 ENCOUNTER — Other Ambulatory Visit: Payer: Self-pay | Admitting: Urgent Care

## 2023-04-16 NOTE — Telephone Encounter (Signed)
Copied from CRM [id number]. Topic: Clinical - Medication Refill >> Apr 16, 2023  8:47 AM Almira Coaster wrote: Most Recent Primary Care Visit:  Provider: [facility]  Department: [facility]  Visit Type: LAB  Date: 02/08/2023  Medication: lamoTRIgine (LAMICTAL) 25 MG tablet  Has the patient contacted their pharmacy? No, she is no longer with the mail order pharmacy and prescription needs to be sent to [facility]. (Agent: If no, request that the patient contact the pharmacy for the refill. If patient does not wish to contact the pharmacy document the reason why and proceed with request.) (Agent: If yes, when and what did the pharmacy advise?)  Is this the correct pharmacy for this prescription? Yes If no, delete pharmacy and type the correct one.  This is the patient's preferred pharmacy:    [facility] [address], [place] - [address] [place] [address] Phone: [phone] Fax: [phone]   Has the prescription been filled recently? No  Is the patient out of the medication? Yes  Has the patient been seen for an appointment in the last year OR does the patient have an upcoming appointment? Yes  Can we respond through MyChart? Yes  Agent: Please be advised that Rx refills may take up to 3 business days. We ask that you follow-up with your pharmacy.

## 2023-04-22 ENCOUNTER — Other Ambulatory Visit: Payer: Self-pay

## 2023-04-22 MED ORDER — LAMOTRIGINE 25 MG PO TABS: 75.0000 mg | ORAL_TABLET | Freq: Two times a day (BID) | ORAL | 2 refills | Status: DC

## 2023-04-25 ENCOUNTER — Ambulatory Visit: Payer: Medicare Other | Admitting: Urgent Care

## 2023-04-25 VITALS — BP 95/62 | HR 65 | Wt 145.0 lb

## 2023-04-25 DIAGNOSIS — B372 Candidiasis of skin and nail: Secondary | ICD-10-CM

## 2023-04-25 DIAGNOSIS — R7989 Other specified abnormal findings of blood chemistry: Secondary | ICD-10-CM

## 2023-04-25 DIAGNOSIS — N898 Other specified noninflammatory disorders of vagina: Secondary | ICD-10-CM

## 2023-04-25 DIAGNOSIS — E1149 Type 2 diabetes mellitus with other diabetic neurological complication: Secondary | ICD-10-CM | POA: Diagnosis not present

## 2023-04-25 DIAGNOSIS — N390 Urinary tract infection, site not specified: Secondary | ICD-10-CM

## 2023-04-25 LAB — POCT GLYCOSYLATED HEMOGLOBIN (HGB A1C)
HbA1c POC (<> result, manual entry): 5.8 % (ref 4.0–5.6)
HbA1c, POC (controlled diabetic range): 5.8 % (ref 0.0–7.0)
HbA1c, POC (prediabetic range): 5.8 % (ref 5.7–6.4)
Hemoglobin A1C: 5.8 % — AB (ref 4.0–5.6)

## 2023-04-25 LAB — POC URINALSYSI DIPSTICK (AUTOMATED)
Bilirubin, UA: NEGATIVE
Blood, UA: NEGATIVE
Glucose, UA: NEGATIVE
Ketones, UA: NEGATIVE
Leukocytes, UA: NEGATIVE
Nitrite, UA: NEGATIVE
Protein, UA: NEGATIVE
Spec Grav, UA: 1.01 (ref 1.010–1.025)
Urobilinogen, UA: 0.2 U/dL
pH, UA: 6 (ref 5.0–8.0)

## 2023-04-25 LAB — AMMONIA: Ammonia: 23 umol/L (ref 11–35)

## 2023-04-25 MED ORDER — NYSTATIN 100000 UNIT/GM EX POWD: 1.0000 | Freq: Three times a day (TID) | CUTANEOUS | 0 refills | Status: AC

## 2023-04-25 NOTE — Progress Notes (Signed)
 Established Patient Office Visit  Subjective:  Patient ID: Heather Luna, female    DOB: January 11, 1951  Age: 73 y.o. MRN: [id number]  Chief Complaint  Patient presents with   Follow-up    3 month follow up. Discuss liver condition. She does not think the Myrbetriq   is working. She would also like a referral to podiatry     HPI  Discussed the use of AI scribe software for clinical note transcription with the patient, who gave verbal consent to proceed.  History of Present Illness   Heather Luna is a 73 year old female with recurrent urinary tract infections and cirrhosis who presents for follow-up and evaluation of a mole in the vaginal area. She is accompanied by her daughter.  She has concerns about a mole in her vaginal area that itches, burns, and bleeds. She has been using mupirocin and an antifungal cream on the area, as prescribed by a previous doctor, but is unsure of its effectiveness.  She has a history of recurrent urinary tract infections (UTIs) and recalls having a UTI in November for which she was prescribed Keflex , which she continues to take daily. She wonders if the mole might be related to her UTIs. She denies any urinary symptoms today. She admits she is now drinking more water and feels better.  She is currently taking lactulose  twice daily for elevated ammonia levels due to cirrhosis, which was diagnosed by her gastroenterologist. She reports normal bowel movements without diarrhea. She is tolerating the medication well and daughter admits that her mom seems less confused since starting the medication. She is scheduled for a colonoscopy and endoscopy on March 31st and has a mammogram scheduled for February 13th.  She has been undergoing occupational therapy to improve hand function, wearing a glove to help with swelling. Therapy was interrupted in December due to insurance issues but resumed in January with [employer]. She notes improvement in hand  mobility but still cannot hold objects with her left hand, which was her dominant hand before a stroke.  Her current medications include metformin  500 mg twice daily, Lamictal , Agranox, Celexa , B vitamins, Lipitor, and Myrbetriq , although she plans to discontinue Myrbetriq  due to its cost and lack of efficacy. She has reduced her sweet tea intake to two glasses per day and drinks water throughout the day, aided by using smaller bottles and considering flavored water options.  She is concerned about undergoing a colonoscopy due to a fear of having another stroke, as she had polyps removed in the past, but is unsure if they were precancerous. She is also concerned about cirrhosis, which she initially misunderstood as cancer. We discussed this today.     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) 01/02/2023   Hepatic cirrhosis (HCC) 01/02/2023   Recurrent falls 01/02/2023   History of seizure disorder 01/02/2023   Encephalomalacia with cerebral infarction (HCC) 01/02/2023   Recurrent UTI 09/24/2013   Absence of bladder continence 04/23/2012   Closed fracture of phalanx of foot 02/20/2012   Epilepsy (HCC) 07/03/2011   CVA (cerebral vascular accident) (HCC) 07/26/2005   Past Medical History:  Diagnosis Date   Anxiety    Arthritis    Cataract  Chronic UTI    Depression    Diabetes mellitus without complication (HCC)    Seizures (HCC)    Stroke [facility])    Past Surgical History:  Procedure Laterality Date   ABDOMINAL HYSTERECTOMY     APPENDECTOMY     BREAST SURGERY     left breast   CHOLECYSTECTOMY     EYE SURGERY     left cataract   FRACTURE SURGERY     R foot   MASTECTOMY Left 2007    Social History   Tobacco Use   Smoking status: Never   Smokeless tobacco: Never  Vaping Use   Vaping status: Never Used  Substance Use Topics   Alcohol use: Never   Drug use: Never      ROS: as noted in HPI  Objective:     BP 95/62   Pulse 65   Wt 145 lb (65.8 kg)   SpO2 100%   BMI 26.52 kg/m  Wt Readings from Last 3 Encounters:  04/25/23 145 lb (65.8 kg)  03/21/23 150 lb (68 kg)  01/18/23 175 lb (79.4 kg)      Physical Exam Vitals and nursing note reviewed. Exam conducted with a chaperone present (daughter).  Constitutional:      General: She is not in acute distress.    Appearance: Normal appearance. She is not ill-appearing, toxic-appearing or diaphoretic.  HENT:     Head: Normocephalic and atraumatic.     Nose: Nose normal.     Mouth/Throat:     Mouth: Mucous membranes are moist.     Pharynx: Oropharynx is clear. No oropharyngeal exudate or posterior oropharyngeal erythema.  Eyes:     General: No scleral icterus.       Right eye: No discharge.        Left eye: No discharge.     Extraocular Movements: Extraocular movements intact.     Pupils: Pupils are equal, round, and reactive to light.  Neck:     Thyroid : No thyroid  mass, thyromegaly or thyroid  tenderness.  Cardiovascular:     Rate and Rhythm: Normal rate and regular rhythm.     Pulses: Normal pulses.  Pulmonary:     Effort: Pulmonary effort is normal. No respiratory distress.     Breath sounds: Normal breath sounds. No stridor. No wheezing or rhonchi.  Abdominal:     General: Abdomen is flat. Bowel sounds are normal. There is no distension.     Palpations: Abdomen is soft. There is no mass.     Tenderness: There is no abdominal tenderness.  Genitourinary:   Musculoskeletal:        General: Deformity (L arm/hand contracture) present.     Cervical back: Neck supple. No rigidity or tenderness.     Right lower leg: No edema.     Left lower leg: No edema.     Comments: Pt with chronic  hemiplegia on L Pt in left leg brace Chronic contracture to L arm/ hand  Lymphadenopathy:     Cervical: No cervical adenopathy.  Skin:    General: Skin is warm and dry.     Coloration: Skin is pale.     Findings: No bruising, erythema or rash.  Neurological:     General: No focal deficit present.     Mental Status: She is alert and oriented to person, place, and time.     Sensory: No sensory deficit.     Motor: Weakness (chronic L sided weakness.) present.     Gait:  Gait abnormal (slow, shuffling gait. Abnormal foot placement on L secondary to bracing).  Psychiatric:        Mood and Affect: Mood normal.        Behavior: Behavior normal.      Results for orders placed or performed in visit on 04/25/23  POCT Urinalysis Dipstick (Automated)  Result Value Ref Range   Color, UA yellow    Clarity, UA clear    Glucose, UA Negative Negative   Bilirubin, UA negative    Ketones, UA negative    Spec Grav, UA 1.010 1.010 - 1.025   Blood, UA negative    pH, UA 6.0 5.0 - 8.0   Protein, UA Negative Negative   Urobilinogen, UA 0.2 0.2 or 1.0 E.U./dL   Nitrite, UA negative    Leukocytes, UA Negative Negative  POCT glycosylated hemoglobin (Hb A1C)  Result Value Ref Range   Hemoglobin A1C 5.8 (A) 4.0 - 5.6 %   HbA1c POC (<> result, manual entry) 5.8 4.0 - 5.6 %   HbA1c, POC (prediabetic range) 5.8 5.7 - 6.4 %   HbA1c, POC (controlled diabetic range) 5.8 0.0 - 7.0 %    Last CBC Lab Results  Component Value Date   WBC 5.8 03/21/2023   HGB 12.6 03/21/2023   HCT 37.8 03/21/2023   MCV 95.8 03/21/2023   MCH 34.3 (H) 08/19/2021   RDW 16.1 (H) 03/21/2023   PLT 105.0 (L) 03/21/2023   Last metabolic panel Lab Results  Component Value Date   GLUCOSE 101 (H) 03/21/2023   NA 141 03/21/2023   K 4.1 03/21/2023   CL 108 03/21/2023   CO2 24 03/21/2023   BUN 18 03/21/2023   CREATININE 0.77 03/21/2023   GFR 77.63 03/21/2023   CALCIUM  9.6 03/21/2023   PROT 7.0 03/21/2023   ALBUMIN 4.4  03/21/2023   BILITOT 0.8 03/21/2023   ALKPHOS 92 03/21/2023   AST 25 03/21/2023   ALT 18 03/21/2023   ANIONGAP 8 08/19/2021   Last lipids Lab Results  Component Value Date   CHOL 88 01/02/2023   HDL 38.40 (L) 01/02/2023   LDLCALC 30 01/02/2023   TRIG 102.0 01/02/2023   CHOLHDL 2 01/02/2023   Last hemoglobin A1c Lab Results  Component Value Date   HGBA1C 5.8 (A) 04/25/2023   HGBA1C 5.8 04/25/2023   HGBA1C 5.8 04/25/2023   HGBA1C 5.8 04/25/2023   Last thyroid  functions Lab Results  Component Value Date   TSH 1.15 01/02/2023   Last vitamin D  No results found for: MARIEN BOLLS, VD25OH Last vitamin B12 and Folate Lab Results  Component Value Date   VITAMINB12 404 01/02/2023   FOLATE >24.2 01/02/2023      The ASCVD Risk score (Arnett DK, et al., 2019) failed to calculate for the following reasons:   Risk score cannot be calculated because patient has a medical history suggesting prior/existing ASCVD  Assessment & Plan:  Recurrent UTI -     POCT Urinalysis Dipstick (Automated)  Type 2 diabetes mellitus with neurological complications (HCC) -     POCT glycosylated hemoglobin (Hb A1C)  Vaginal irritation  Candidal intertrigo -     Nystatin ; Apply 1 Application topically 3 (three) times daily.  Dispense: 15 g; Refill: 0  Increased ammonia level -     Ammonia  Assessment and Plan    Vulvar Irritation Itching, burning, and bleeding is noted in the vulvar area, which is NOT associated with the skin tag. Current treatment with mupirocin and antifungal cream  not effective. Noted additional irritation and fissure in the vaginal area. -Discontinue mupirocin and antifungal cream. -Start nystatin  powder 2-3 times a day externally on the vulva and inner thighs. -Apply A&D ointment to the vaginal area. -Use water wipes for hygiene.  Urinary Tract Infections No current symptoms. Urine sample today is normal. Patient on daily Keflex  for  prophylaxis. -Continue Keflex  as prophylaxis. -continue increased water intake  Cirrhosis Patient on lactulose  for elevated ammonia levels. No current symptoms of confusion or falls. -Check ammonia level today.  Prediabetes A1c improved from 6.3 to 5.8 on metformin  500mg  twice daily. -Continue metformin  500mg  twice daily.  Overactive Bladder Myrbetriq  not providing significant improvement and is costly. -Discontinue Myrbetriq . -Discuss with urologist about potential bladder stimulator or evaluation for urinary retention.  Colon Polyps Patient had 8 polyps removed previously. Scheduled for colonoscopy on March 31st. Patient hesitant due to fear of stroke. -Discuss with gastroenterologist about risk and benefits of colonoscopy. I strongly encouraged pt to proceed with procedure  General Health Maintenance -Scheduled for mammogram on February 13th. -Occupational therapy ongoing for hand function, using a glove for swelling.         Return in about 6 months (around 10/23/2023).   Benton LITTIE Gave, PA

## 2023-04-25 NOTE — Patient Instructions (Addendum)
 Dr. Perley Bradley - Urology [address] [place], [place], [place] [address]  Phone: [phone]   Use nystatin  2-3 times daily until resolved. Use A&D ointment on the vaginal area, use water wipes to wipe.

## 2023-04-26 ENCOUNTER — Ambulatory Visit: Payer: Medicare HMO | Admitting: Urgent Care

## 2023-04-27 ENCOUNTER — Encounter: Payer: Self-pay | Admitting: Urgent Care

## 2023-05-02 ENCOUNTER — Encounter (HOSPITAL_BASED_OUTPATIENT_CLINIC_OR_DEPARTMENT_OTHER): Payer: Self-pay | Admitting: Radiology

## 2023-05-02 ENCOUNTER — Ambulatory Visit (HOSPITAL_BASED_OUTPATIENT_CLINIC_OR_DEPARTMENT_OTHER)
Admission: RE | Admit: 2023-05-02 | Discharge: 2023-05-02 | Disposition: A | Discharge status: Home / Self Care | Payer: Medicare Other | Source: Ambulatory Visit | Attending: Urgent Care | Admitting: Urgent Care

## 2023-05-02 DIAGNOSIS — Z1231 Encounter for screening mammogram for malignant neoplasm of breast: Secondary | ICD-10-CM | POA: Insufficient documentation

## 2023-05-06 ENCOUNTER — Other Ambulatory Visit: Payer: Self-pay | Admitting: Urgent Care

## 2023-05-07 MED ORDER — CITALOPRAM HYDROBROMIDE 20 MG PO TABS: 20.0000 mg | ORAL_TABLET | Freq: Every day | ORAL | 1 refills | Status: DC

## 2023-05-13 ENCOUNTER — Telehealth: Payer: Self-pay

## 2023-05-13 NOTE — Telephone Encounter (Signed)
 Copied from CRM [id number]. Topic: Clinical - Request for Lab/Test Order >> May 13, 2023 11:39 AM Florestine Avers wrote: Reason for CRM: Caitlyn calling from [facility] wants to know if order were received for the patient. Best call back [phone] ext [phone]   order number: [id number]

## 2023-05-14 NOTE — Telephone Encounter (Signed)
Received and signed

## 2023-05-19 ENCOUNTER — Other Ambulatory Visit: Payer: Self-pay | Admitting: Urgent Care

## 2023-05-20 ENCOUNTER — Other Ambulatory Visit (HOSPITAL_BASED_OUTPATIENT_CLINIC_OR_DEPARTMENT_OTHER): Payer: Self-pay

## 2023-05-20 MED ORDER — ASPIRIN-DIPYRIDAMOLE ER 25-200 MG PO CP12
1.0000 | ORAL_CAPSULE | Freq: Two times a day (BID) | ORAL | 1 refills | Status: DC
  Filled 2023-05-20 – 2023-05-23 (×2): qty 180, 90d supply, fill #0
  Filled 2023-08-21: qty 180, 90d supply, fill #1

## 2023-05-20 NOTE — Telephone Encounter (Signed)
Please fill, if appropriate.  

## 2023-05-23 ENCOUNTER — Other Ambulatory Visit (HOSPITAL_BASED_OUTPATIENT_CLINIC_OR_DEPARTMENT_OTHER): Payer: Self-pay

## 2023-05-23 ENCOUNTER — Other Ambulatory Visit: Payer: Self-pay

## 2023-05-24 ENCOUNTER — Other Ambulatory Visit (HOSPITAL_BASED_OUTPATIENT_CLINIC_OR_DEPARTMENT_OTHER): Payer: Self-pay

## 2023-05-24 ENCOUNTER — Other Ambulatory Visit: Payer: Self-pay

## 2023-05-27 ENCOUNTER — Other Ambulatory Visit (HOSPITAL_BASED_OUTPATIENT_CLINIC_OR_DEPARTMENT_OTHER): Payer: Self-pay

## 2023-05-27 ENCOUNTER — Other Ambulatory Visit: Payer: Self-pay

## 2023-06-06 ENCOUNTER — Telehealth: Payer: Self-pay

## 2023-06-06 NOTE — Telephone Encounter (Signed)
 Spoke with [facility] team

## 2023-06-06 NOTE — Telephone Encounter (Signed)
 Home Health verbal orders Caller Name:[facility] Agency Name: Gayland Curry number: [phone]  Requesting OT/PT/Skilled nursing/Social Work/Speech: Mobile Xray  Reason:Pt hit wrist on 03/05 and seems to be getting worse

## 2023-06-06 NOTE — Telephone Encounter (Signed)
 Yes this is approved

## 2023-06-07 ENCOUNTER — Other Ambulatory Visit: Payer: Self-pay | Admitting: Physician Assistant

## 2023-06-07 ENCOUNTER — Telehealth: Payer: Self-pay

## 2023-06-07 NOTE — Progress Notes (Signed)
 Daughter called and wanted to cancel this procedure as the patient is refusing. Will remove her from the schedule.

## 2023-06-07 NOTE — Telephone Encounter (Signed)
 Home health orders received 06/07/23 for [name] Home health initiation orders: No.  Home health re-certification orders: Yes. Patient last seen by ordering physician for this condition: 04/25/23. Must be less than 90 days for re-certification and less than 30 days prior for initiation. Visit must have been for the condition the orders are being placed.  Patient meets criteria for Physician to sign orders: Yes.        Current med list has been attached: No        Orders placed on physicians desk for signature: 06/07/23 (date) If patient does not meet criteria for orders to be signed: pt was called to schedule appt. Appt is scheduled for n/a.   Heather Luna

## 2023-06-07 NOTE — Telephone Encounter (Signed)
 Paperwork signed and completed.

## 2023-06-10 ENCOUNTER — Encounter: Payer: Self-pay | Admitting: Urgent Care

## 2023-06-10 ENCOUNTER — Telehealth: Payer: Self-pay | Admitting: Physician Assistant

## 2023-06-10 DIAGNOSIS — S52532A Colles' fracture of left radius, initial encounter for closed fracture: Secondary | ICD-10-CM

## 2023-06-10 NOTE — Telephone Encounter (Signed)
 Pt results returned with left wrist fracture. PCP notified

## 2023-06-10 NOTE — Telephone Encounter (Signed)
 Inbound call from patient's daughter stating she received a call today regarding 3/31 procedure but unsure who. Daughter is requesting a call back. Please advise, thank you.

## 2023-06-10 NOTE — Telephone Encounter (Signed)
 Heather Luna, it looks like you scheduled this procedure. It has been cancelled. Not sure by who or why. Thanks

## 2023-06-10 NOTE — Telephone Encounter (Signed)
 noted

## 2023-06-11 ENCOUNTER — Telehealth: Payer: Self-pay

## 2023-06-11 NOTE — Telephone Encounter (Signed)
 Procedure:colon Procedure date: 06/17/23 Procedure location: [facility] Arrival Time: 3 Spoke with the patient Y/N: n daughter Any prep concerns?   Has the patient obtained the prep from the pharmacy ?  Do you have a care partner and transportation:  Any additional concerns?  Pt daughter cancel the appoint last week.

## 2023-06-11 NOTE — Telephone Encounter (Signed)
 Returned call, sounds like when the phone is answered on the patients end it goes out and no one responds when I talk. (Called Daughter)    I am not sure with this patient, I did not cancel her and she was cleared from a cardiac standpoint and Danis was aware. I am not sure why she was cancelled.   I just called Endo at [facility] scheduling, They said the procedure was cancelled by Hadassah Pais on 3/21 because patient refuses this procedure.

## 2023-06-11 NOTE — Telephone Encounter (Signed)
 Yes, fax received. Pt message sent to patient yesterday and referral was placed to ortho

## 2023-06-11 NOTE — Telephone Encounter (Signed)
 FYI  Copied from CRM [id number]. Topic: Clinical - Lab/Test Results >> Jun 11, 2023 10:01 AM Drema Balzarine wrote: Reason for CRM: [facility] called to confirm that fax was received regarding patients x-ray results. Per clinic access line, fax was received. [facility] wants to let provider know that patient has a wrist fracture.

## 2023-06-13 ENCOUNTER — Encounter: Payer: Self-pay | Admitting: Physician Assistant

## 2023-06-13 ENCOUNTER — Ambulatory Visit: Admitting: Physician Assistant

## 2023-06-13 ENCOUNTER — Other Ambulatory Visit (INDEPENDENT_AMBULATORY_CARE_PROVIDER_SITE_OTHER): Payer: Self-pay

## 2023-06-13 DIAGNOSIS — M25532 Pain in left wrist: Secondary | ICD-10-CM

## 2023-06-13 NOTE — Progress Notes (Signed)
 Office Visit Note   Patient: Heather Luna           Date of Birth: 06-03-1950           MRN: [id number] Visit Date: 06/13/2023              Requested by: Maretta Bees, PA [address] [place],  [place] [address] PCP: Maretta Bees, [place]   Assessment & Plan: Visit Diagnoses:  1. Pain in left wrist     Plan: Impression is left wrist distal radius and ulnar styloid fractures.  Patient has no use of the left upper extremity.  Will treat this in a Velcro wrist splint.  Follow-up in 3 weeks for repeat evaluation and x-rays of the left wrist.  Call with concerns or questions.  Follow-Up Instructions: Return in about 3 weeks (around 07/04/2023).   Orders:  Orders Placed This Encounter  Procedures   XR Wrist Complete Left   No orders of the defined types were placed in this encounter.     Procedures: No procedures performed   Clinical Data: No additional findings.   Subjective: Chief Complaint  Patient presents with   Left Wrist - Pain    HPI patient is a very pleasant 73 year old female who comes in today following an injury to her left wrist.  She is status post 2 CVAs with the first 1 being in the 1990s which caused hemiparesis to the left upper and lower extremities.  She has had no use of the left upper extremity since.  She notes that about 3 weeks ago she bumped her left hand on a door.  She is had pain since that has gradually improved.  She initially had bruising.  She takes an occasional Tylenol for pain.  Review of Systems as detailed in HPI.  All others reviewed and are negative.   Objective: Vital Signs: There were no vitals taken for this visit.  Physical Exam well-developed well-nourished female in no acute distress.  Alert and oriented x 3.  Ortho Exam left wrist exam reveals tenderness to the dorsal of the wrist as well as into the ulnar styloid.  She has no motor function to the left wrist.  Fingers are warm and well-perfused.  Specialty  Comments:  No specialty comments available.  Imaging: XR Wrist Complete Left Result Date: 06/13/2023 X-rays demonstrate fractures through the radial styloid and distal radius.  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) 01/02/2023   Hepatic cirrhosis (HCC) 01/02/2023   Recurrent falls 01/02/2023   History of seizure disorder 01/02/2023   Encephalomalacia with cerebral infarction (HCC) 01/02/2023   Recurrent UTI 09/24/2013   Absence of bladder continence 04/23/2012   Closed fracture of phalanx of foot 02/20/2012   Epilepsy (HCC) 07/03/2011   CVA (cerebral vascular accident) (HCC) 07/26/2005   Past Medical History:  Diagnosis Date   Anxiety    Arthritis    Cataract    Chronic UTI    Depression    Diabetes mellitus without complication (HCC)    Seizures (HCC)    Stroke (HCC)     Family History  Problem Relation  Age of Onset   Rheum arthritis Mother    Cancer Mother    Heart failure Father    Heart attack Father    Breast cancer Paternal Grandmother     Past Surgical History:  Procedure Laterality Date   ABDOMINAL HYSTERECTOMY     APPENDECTOMY     BREAST SURGERY     left breast   CHOLECYSTECTOMY     EYE SURGERY     left cataract   FRACTURE SURGERY     R foot   MASTECTOMY Left 2007   Social History   Occupational History   Occupation: retired  Tobacco Use   Smoking status: Never   Smokeless tobacco: Never  Vaping Use   Vaping status: Never Used  Substance and Sexual Activity   Alcohol use: Never   Drug use: Never   Sexual activity: Never

## 2023-06-17 ENCOUNTER — Encounter (HOSPITAL_COMMUNITY): Payer: Self-pay

## 2023-06-17 ENCOUNTER — Ambulatory Visit (HOSPITAL_COMMUNITY): Admit: 2023-06-17 | Payer: Medicare Other | Admitting: Gastroenterology

## 2023-06-17 SURGERY — COLONOSCOPY WITH PROPOFOL
Anesthesia: Monitor Anesthesia Care

## 2023-06-18 ENCOUNTER — Encounter: Payer: Self-pay | Admitting: Urgent Care

## 2023-06-18 ENCOUNTER — Telehealth: Payer: Self-pay

## 2023-06-18 NOTE — Telephone Encounter (Signed)
 FYI. I will be on the look out for new order  Copied from CRM [id number]. Topic: General - Other >> Jun 18, 2023 10:55 AM Elizebeth Brooking wrote: Reason for CRM: Patient Physical Therapist called stating patient Lost weight AOF doesnt fit in anymore , will be sending an order to go get fitted for another, will be sending an order for this .

## 2023-06-18 NOTE — Telephone Encounter (Signed)
 Form faxed back.

## 2023-06-18 NOTE — Telephone Encounter (Signed)
 This was signed.

## 2023-06-24 ENCOUNTER — Other Ambulatory Visit: Payer: Self-pay | Admitting: Urgent Care

## 2023-06-25 ENCOUNTER — Other Ambulatory Visit: Payer: Self-pay | Admitting: Urgent Care

## 2023-06-25 MED ORDER — ATORVASTATIN CALCIUM 40 MG PO TABS: 40.0000 mg | ORAL_TABLET | Freq: Every day | ORAL | 1 refills | Status: DC

## 2023-06-25 MED ORDER — METFORMIN HCL 500 MG PO TABS: 500.0000 mg | ORAL_TABLET | Freq: Two times a day (BID) | ORAL | 1 refills | Status: DC

## 2023-06-25 NOTE — Telephone Encounter (Signed)
 Copied from CRM [id number]. Topic: Clinical - Medication Refill >> Jun 25, 2023  2:11 PM Sim Boast F wrote: Most Recent Primary Care Visit:  Provider: Maretta Bees  Department: [facility]  Visit Type: OFFICE VISIT  Date: 04/25/2023  Medication: metFORMIN, atorvastatin   Has the patient contacted their pharmacy? Yes (Agent: If no, request that the patient contact the pharmacy for the refill. If patient does not wish to contact the pharmacy document the reason why and proceed with request.) (Agent: If yes, when and what did the pharmacy advise?)  Is this the correct pharmacy for this prescription? Yes If no, delete pharmacy and type the correct one.  This is the patient's preferred pharmacy:   [facility] [address], [place] - [address] [place] [address] Phone: [phone] Fax: [phone]   Has the prescription been filled recently? Yes  Is the patient out of the medication? No  Has the patient been seen for an appointment in the last year OR does the patient have an upcoming appointment? Yes  Can we respond through MyChart? Yes  Agent: Please be advised that Rx refills may take up to 3 business days. We ask that you follow-up with your pharmacy.

## 2023-07-01 ENCOUNTER — Emergency Department (HOSPITAL_COMMUNITY)

## 2023-07-01 ENCOUNTER — Other Ambulatory Visit: Payer: Self-pay

## 2023-07-01 ENCOUNTER — Emergency Department (HOSPITAL_COMMUNITY)
Admission: EM | Admit: 2023-07-01 | Discharge: 2023-07-01 | Discharge status: Against Medical Advice | Source: Home / Self Care

## 2023-07-01 ENCOUNTER — Emergency Department (HOSPITAL_BASED_OUTPATIENT_CLINIC_OR_DEPARTMENT_OTHER)

## 2023-07-01 ENCOUNTER — Emergency Department (HOSPITAL_BASED_OUTPATIENT_CLINIC_OR_DEPARTMENT_OTHER)
Admission: EM | Admit: 2023-07-01 | Discharge: 2023-07-01 | Disposition: A | Discharge status: Home / Self Care | Attending: Emergency Medicine | Admitting: Emergency Medicine

## 2023-07-01 DIAGNOSIS — Z8673 Personal history of transient ischemic attack (TIA), and cerebral infarction without residual deficits: Secondary | ICD-10-CM | POA: Insufficient documentation

## 2023-07-01 DIAGNOSIS — Z7984 Long term (current) use of oral hypoglycemic drugs: Secondary | ICD-10-CM | POA: Insufficient documentation

## 2023-07-01 DIAGNOSIS — S0101XA Laceration without foreign body of scalp, initial encounter: Secondary | ICD-10-CM | POA: Diagnosis not present

## 2023-07-01 DIAGNOSIS — W01198A Fall on same level from slipping, tripping and stumbling with subsequent striking against other object, initial encounter: Secondary | ICD-10-CM | POA: Insufficient documentation

## 2023-07-01 DIAGNOSIS — Z23 Encounter for immunization: Secondary | ICD-10-CM | POA: Diagnosis not present

## 2023-07-01 DIAGNOSIS — E119 Type 2 diabetes mellitus without complications: Secondary | ICD-10-CM | POA: Diagnosis not present

## 2023-07-01 DIAGNOSIS — Y92009 Unspecified place in unspecified non-institutional (private) residence as the place of occurrence of the external cause: Secondary | ICD-10-CM | POA: Diagnosis not present

## 2023-07-01 DIAGNOSIS — S0990XA Unspecified injury of head, initial encounter: Secondary | ICD-10-CM | POA: Diagnosis present

## 2023-07-01 MED ORDER — TETANUS-DIPHTH-ACELL PERTUSSIS 5-2.5-18.5 LF-MCG/0.5 IM SUSY
0.5000 mL | PREFILLED_SYRINGE | Freq: Once | INTRAMUSCULAR | Status: AC
  Administered 2023-07-01: 0.5 mL via INTRAMUSCULAR
  Filled 2023-07-01: qty 0.5

## 2023-07-01 MED ORDER — TETANUS-DIPHTH-ACELL PERTUSSIS 5-2.5-18.5 LF-MCG/0.5 IM SUSY: 0.5000 mL | PREFILLED_SYRINGE | Freq: Once | INTRAMUSCULAR | Status: DC

## 2023-07-01 NOTE — ED Provider Notes (Signed)
 [place] EMERGENCY DEPARTMENT AT [facility] Provider Note   CSN: [id number] Arrival date & time: 07/01/23  2149     History  Chief Complaint  Patient presents with   Heather Luna is a 73 y.o. female.  The history is provided by the patient and a relative.  Fall  She has history of diabetes, stroke, seizures and comes in following a fall at home.  She tried to sit down in a chair and missed and hit the back of her head on a cabinet.  She denies loss of consciousness.  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  She denies other injury from the fall.  Daughter is here and states that her mental state is at its baseline.   Home Medications Prior to Admission medications   Medication Sig Start Date End Date Taking? Authorizing Provider  atorvastatin (LIPITOR) 40 MG tablet Take 1 tablet (40 mg total) by mouth at bedtime. 06/25/23   Crain, Whitney L, PA  b complex vitamins tablet Take 1 tablet by mouth daily. 07/05/11   [provider]  citalopram (CELEXA) 20 MG tablet Take 1 tablet (20 mg total) by mouth daily. 05/07/23   Crain, Whitney L, PA  CRANBERRY PO Take 1 tablet by mouth daily.    [provider]  dipyridamole-aspirin (AGGRENOX) 200-25 MG 12hr capsule Take 1 capsule by mouth 2 (two) times daily. 05/20/23   Guy Sandifer L, PA  lactulose (CHRONULAC) 10 GM/15ML solution Take 15-30 ml up to twice daily for constipation and confusion from cirrhosis, aim for 2 bowel movements a day 03/22/23   Doree Albee, PA-C  lamoTRIgine (LAMICTAL) 25 MG tablet Take 3 tablets (75 mg total) by mouth 2 (two) times daily. 04/22/23   Crain, Whitney L, PA  metFORMIN (GLUCOPHAGE) 500 MG tablet Take 1 tablet (500 mg total) by mouth 2 (two) times daily with a meal. 06/25/23   Crain, Whitney L, PA  nystatin (MYCOSTATIN/NYSTOP) powder Apply 1 Application topically 3 (three) times daily. 04/25/23   Guy Sandifer L,  PA      Allergies    Septra [sulfamethoxazole-trimethoprim] and Levetiracetam    Review of Systems   Review of Systems  All other systems reviewed and are negative.   Physical Exam Updated Vital Signs BP 106/61   Pulse 69   Temp 98.7 F (37.1 C)   Resp 12   SpO2 93%  Physical Exam Vitals and nursing note reviewed.   74 year old female, resting comfortably and in no acute distress. Vital signs are normal. Oxygen saturation is 97%, which is normal. Head is normocephalic.  There is a laceration on the left side of the occiput. PERRLA, EOMI.  Neck is nontender. Back is nontender and there is no CVA tenderness. Lungs are clear without rales, wheezes, or rhonchi. Chest is nontender. Heart has regular rate and rhythm without murmur. Abdomen is soft, flat, nontender. Extremities.  There is no swelling or deformity or tenderness.  Full passive range of motion is present, but range of motion is restricted on the left because of spastic hemiparesis.  Wrist brace is present on the left wrist and is not removed. Skin is warm and dry without rash. Neurologic: Awake and alert and oriented, cranial nerves are intact, pre-existing left spastic hemiparesis present.  ED Results / Procedures / Treatments    EKG EKG Interpretation Date/Time:  Monday July 01 2023 22:32:04 EDT Ventricular Rate:  72 PR Interval:  156 QRS Duration:  103 QT Interval:  450 QTC Calculation: 493 R Axis:   -37  Text Interpretation: Sinus rhythm Left ventricular hypertrophy Borderline prolonged QT interval When compared with ECG of 08/19/2021, QT has shortened Confirmed by Alissa April ([id number]) on 07/01/2023 10:58:30 PM  Radiology CT Cervical Spine Wo Contrast Result Date: 07/01/2023 CLINICAL DATA:  Recent fall with neck pain, initial encounter EXAM: CT CERVICAL SPINE WITHOUT CONTRAST TECHNIQUE: Multidetector CT imaging of the cervical spine was performed without intravenous contrast. Multiplanar CT image  reconstructions were also generated. RADIATION DOSE REDUCTION: This exam was performed according to the departmental dose-optimization program which includes automated exposure control, adjustment of the mA and/or kV according to patient size and/or use of iterative reconstruction technique. COMPARISON:  12/30/2022 FINDINGS: Alignment: Mild reversal of the normal cervical lordosis is noted. Skull base and vertebrae: 7 cervical segments are well visualized. Vertebral body height is well maintained. Mild osteophytic changes and facet hypertrophic changes are noted. No acute fracture or acute facet abnormality is noted. Soft tissues and spinal canal: Surrounding soft tissue structures are within normal limits. Upper chest: Visualized lung apices are within normal limits. Other: None. IMPRESSION: Multilevel degenerative changes without acute bony abnormality. Electronically Signed   By: Violeta Grey M.D.   On: 07/01/2023 23:08   CT Head Wo Contrast Result Date: 07/01/2023 CLINICAL DATA:  Trauma EXAM: CT HEAD WITHOUT CONTRAST TECHNIQUE: Contiguous axial images were obtained from the base of the skull through the vertex without intravenous contrast. RADIATION DOSE REDUCTION: This exam was performed according to the departmental dose-optimization program which includes automated exposure control, adjustment of the mA and/or kV according to patient size and/or use of iterative reconstruction technique. COMPARISON:  Head CT 12/30/2022 FINDINGS: Brain: Extensive right frontal lateral encephalomalacia is again noted with ex vacuo dilatation of the right lateral ventricle. Small old infarcts in the bilateral cerebral hemispheres appear unchanged. There is mild periventricular white matter hypodensity, likely chronic small vessel ischemic change. There is no acute intracranial hemorrhage, extra-axial fluid collection, acute infarct or mass effect. There is no hydrocephalus. Vascular: Atherosclerotic calcifications are present  within the cavernous internal carotid arteries. Skull: Normal. Negative for fracture or focal lesion. Sinuses/Orbits: No acute finding. Other: There is left parietal scalp soft tissue swelling and laceration. IMPRESSION: 1. No acute intracranial process. 2. Left parietal scalp soft tissue swelling and laceration. 3. Stable chronic changes. Electronically Signed   By: Tyron Gallon M.D.   On: 07/01/2023 23:07    Procedures .Laceration Repair  Date/Time: 07/01/2023 11:13 PM  Performed by: Alissa April, MD Authorized by: Alissa April, MD   Consent:    Consent obtained:  Verbal   Consent given by:  Patient   Risks, benefits, and alternatives were discussed: yes     Risks discussed:  Infection and pain   Alternatives discussed:  No treatment Universal protocol:    Procedure explained and questions answered to patient or proxy's satisfaction: yes     Relevant documents present and verified: yes     Test results available: yes     Imaging studies available: yes     Required blood products, implants, devices, and special equipment available: yes     Site/side marked: yes     Immediately prior to procedure, a time out was called: yes     Patient identity confirmed:  Verbally with patient and arm band Anesthesia:    Anesthesia method:  None Laceration  details:    Location:  Scalp   Scalp location:  Occipital   Length (cm):  3   Depth (mm):  4 Pre-procedure details:    Preparation:  Patient was prepped and draped in usual sterile fashion and imaging obtained to evaluate for foreign bodies Exploration:    Limited defect created (wound extended): no     Hemostasis achieved with:  Direct pressure   Imaging obtained: x-ray     Imaging outcome: foreign body not noted     Wound exploration: entire depth of wound visualized     Wound extent: no foreign body and no underlying fracture     Contaminated: no   Treatment:    Area cleansed with:  Saline   Amount of cleaning:  Standard    Debridement:  None   Undermining:  None   Scar revision: no   Skin repair:    Repair method:  Staples   Number of staples:  6 Approximation:    Approximation:  Close Repair type:    Repair type:  Simple Post-procedure details:    Dressing:  Open (no dressing)   Procedure completion:  Tolerated well, no immediate complications   Cardiac monitor shows normal sinus rhythm, per my interpretation.  Medications Ordered in ED Medications  Tdap (BOOSTRIX) injection 0.5 mL (0.5 mLs Intramuscular Given 07/01/23 2305)    ED Course/ Medical Decision Making/ A&P                                 Medical Decision Making Amount and/or Complexity of Data Reviewed Radiology: ordered.   Fall with scalp laceration and patient who is on an antiplatelet agent (dipyridamole).  At the trauma center, this would qualify as a level 2 trauma.  I have reviewed her electrocardiogram, and my interpretation is left ventricular hypertrophy and borderline prolonged QT interval.  I have reviewed her past records, and last recorded tetanus immunization was in 2007, I have ordered a Tdap booster.  CT of head and cervical spine show evidence of scalp hematoma and laceration but no intracranial injury and no bony injury.  I have independently viewed the images, and agree with the radiologist's interpretation.  I closed the laceration with staples.  I am discharging her with instructions to have staples removed in 7 days.  CRITICAL CARE Performed by: Dione Booze Total critical care time: 35 minutes Critical care time was exclusive of separately billable procedures and treating other patients. Critical care was necessary to treat or prevent imminent or life-threatening deterioration. Critical care was time spent personally by me on the following activities: development of treatment plan with patient and/or surrogate as well as nursing, discussions with consultants, evaluation of patient's response to treatment,  examination of patient, obtaining history from patient or surrogate, ordering and performing treatments and interventions, ordering and review of laboratory studies, ordering and review of radiographic studies, pulse oximetry and re-evaluation of patient's condition.  Final Clinical Impression(s) / ED Diagnoses Final diagnoses:  Fall at home, initial encounter  Scalp laceration, initial encounter    Rx / DC Orders ED Discharge Orders     None         Dione Booze, MD 07/01/23 2317

## 2023-07-01 NOTE — ED Triage Notes (Addendum)
 Daughter reports she had a fall today around 730pm. Patient takes an antiplatelet. Presents with blood on the shirt and laceration to the back of the head. Patient states she was sitting down to eat and she doesn't know if she missed the chair. States she did not passed out. May have hit her head on the closet door.

## 2023-07-01 NOTE — ED Notes (Signed)
 Patient to CT via stretcher.

## 2023-07-03 ENCOUNTER — Telehealth: Payer: Self-pay

## 2023-07-03 NOTE — Telephone Encounter (Signed)
 FyI  Copied from CRM [id number]. Topic: General - Other >> Jul 03, 2023  1:12 PM Deaijah H wrote: Reason for CRM: Christy PT w/ [facility] called in to advise Dr. Gatha Kaska patient had an fall 4/14 w/ visit to ER with staples placed on back of head and discharged same day w/ CT scan cleared. Also a follow up for orthopedic referral for left foot. If needed, please call [phone]

## 2023-07-06 NOTE — Telephone Encounter (Signed)
 Lets get patient scheduled for a follow up in office regarding her falls. thanks

## 2023-07-08 NOTE — Telephone Encounter (Signed)
 Left pts daughter a vm to call and schedule OV.

## 2023-07-10 ENCOUNTER — Ambulatory Visit: Admitting: Physician Assistant

## 2023-07-11 ENCOUNTER — Encounter: Payer: Self-pay | Admitting: Physician Assistant

## 2023-07-11 ENCOUNTER — Other Ambulatory Visit (INDEPENDENT_AMBULATORY_CARE_PROVIDER_SITE_OTHER): Payer: Self-pay

## 2023-07-11 ENCOUNTER — Ambulatory Visit (INDEPENDENT_AMBULATORY_CARE_PROVIDER_SITE_OTHER): Admitting: Physician Assistant

## 2023-07-11 DIAGNOSIS — M25532 Pain in left wrist: Secondary | ICD-10-CM | POA: Diagnosis not present

## 2023-07-11 NOTE — Progress Notes (Signed)
 Office Visit Note   Patient: Heather Luna           Date of Birth: [birth year]           MRN: [id number] Visit Date: 07/11/2023              Requested by: Mandy Second, PA [id number] [place],  [place] [address] PCP: Mandy Second, [place]   Assessment & Plan: Visit Diagnoses:  1. Pain in left wrist     Plan: Impression is 7 weeks status post left distal radius and ulnar styloid fractures.  Patient is clinically and radiographically improving although still not where I would like to see her at this point.  Given her history of disuse osteopenia and fracture healing, would like to order vitamin D  and calcium  labs today.  We will reach out to her with the results.  She will follow-up with us  in 5 weeks for repeat evaluation and x-rays of the left wrist.  Call with concerns or questions.  Follow-Up Instructions: Return in about 5 weeks (around 08/15/2023).   Orders:  Orders Placed This Encounter  Procedures   XR Wrist Complete Left   No orders of the defined types were placed in this encounter.     Procedures: No procedures performed   Clinical Data: No additional findings.   Subjective: Chief Complaint  Patient presents with   Left Wrist - Follow-up    left wrist distal radius and ulnar styloid fractures    HPI patient is a pleasant 73 year old female who comes in today approximately 7 weeks status post distal radius and ulnar styloid fractures.  She is feeling significantly better.  She has been compliant wearing her Velcro splint.  Of note, she has no use of the left upper extremity secondary to previous CVA x 2.  Her daughter tells me she has been giving her vitamin D  supplementation.      Objective: Vital Signs: There were no vitals taken for this visit.    Ortho Exam left wrist exam out of the splint: She still has tenderness to the distal radius.  No tenderness to the ulnar styloid.  Specialty Comments:  No specialty comments  available.  Imaging: XR Wrist Complete Left Result Date: 07/11/2023 X-rays demonstrate evidence of fracture consolidation to both distal radius and ulnar styloid fractures.    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) 01/02/2023   Hepatic cirrhosis (HCC) 01/02/2023   Recurrent falls 01/02/2023   History of seizure disorder 01/02/2023   Encephalomalacia with cerebral infarction (HCC) 01/02/2023   Recurrent UTI 09/24/2013   Absence of bladder continence 04/23/2012   Closed fracture of phalanx of foot 02/20/2012   Epilepsy (HCC) 07/03/2011   CVA (cerebral vascular accident) (HCC) 07/26/2005   Past Medical History:  Diagnosis Date   Anxiety    Arthritis    Cataract    Chronic UTI    Depression    Diabetes mellitus without complication (HCC)    Seizures (HCC)    Stroke (HCC)     Family History  Problem Relation Age of Onset   Rheum arthritis Mother    Cancer Mother    Heart failure Father  Heart attack Father    Breast cancer Paternal Grandmother     Past Surgical History:  Procedure Laterality Date   ABDOMINAL HYSTERECTOMY     APPENDECTOMY     BREAST SURGERY     left breast   CHOLECYSTECTOMY     EYE SURGERY     left cataract   FRACTURE SURGERY     R foot   MASTECTOMY Left 2007   Social History   Occupational History   Occupation: retired  Tobacco Use   Smoking status: Never   Smokeless tobacco: Never  Vaping Use   Vaping status: Never Used  Substance and Sexual Activity   Alcohol use: Never   Drug use: Never   Sexual activity: Never

## 2023-07-12 LAB — VITAMIN D 25 HYDROXY (VIT D DEFICIENCY, FRACTURES): Vit D, 25-Hydroxy: 38 ng/mL (ref 30–100)

## 2023-07-12 LAB — EXTRA LAV TOP TUBE

## 2023-07-12 LAB — CALCIUM: Calcium: 9.3 mg/dL (ref 8.6–10.4)

## 2023-07-12 NOTE — Progress Notes (Signed)
 Can you let patient know vitamin d and calcium  are normal, but that I would still recommend continuing to take the vitamin D supplements she is currently taking

## 2023-07-12 NOTE — Progress Notes (Signed)
Messaged patient via mychart

## 2023-07-17 ENCOUNTER — Other Ambulatory Visit: Payer: Self-pay

## 2023-07-17 MED ORDER — LAMOTRIGINE 25 MG PO TABS: 75.0000 mg | ORAL_TABLET | Freq: Two times a day (BID) | ORAL | 1 refills | Status: DC

## 2023-07-22 ENCOUNTER — Telehealth: Payer: Self-pay

## 2023-07-22 NOTE — Telephone Encounter (Signed)
 document faxed to be filled out by provider. Patient requested to send it back via Fax within 7-days. Document is located in providers tray at front office.

## 2023-07-22 NOTE — Telephone Encounter (Signed)
Forms received and placed on provider's desk.

## 2023-07-23 NOTE — Telephone Encounter (Signed)
Signed/completed.

## 2023-07-23 NOTE — Telephone Encounter (Signed)
 Forms faxed

## 2023-07-25 ENCOUNTER — Encounter: Payer: Self-pay | Admitting: Urgent Care

## 2023-07-25 ENCOUNTER — Ambulatory Visit: Admitting: Urgent Care

## 2023-07-25 VITALS — BP 114/71 | HR 95 | Wt 146.0 lb

## 2023-07-25 DIAGNOSIS — R350 Frequency of micturition: Secondary | ICD-10-CM | POA: Diagnosis not present

## 2023-07-25 DIAGNOSIS — E1149 Type 2 diabetes mellitus with other diabetic neurological complication: Secondary | ICD-10-CM

## 2023-07-25 DIAGNOSIS — R296 Repeated falls: Secondary | ICD-10-CM | POA: Diagnosis not present

## 2023-07-25 DIAGNOSIS — G9389 Other specified disorders of brain: Secondary | ICD-10-CM

## 2023-07-25 DIAGNOSIS — N390 Urinary tract infection, site not specified: Secondary | ICD-10-CM | POA: Diagnosis not present

## 2023-07-25 DIAGNOSIS — K7460 Unspecified cirrhosis of liver: Secondary | ICD-10-CM

## 2023-07-25 DIAGNOSIS — D696 Thrombocytopenia, unspecified: Secondary | ICD-10-CM

## 2023-07-25 DIAGNOSIS — L409 Psoriasis, unspecified: Secondary | ICD-10-CM

## 2023-07-25 DIAGNOSIS — I639 Cerebral infarction, unspecified: Secondary | ICD-10-CM

## 2023-07-25 DIAGNOSIS — I69352 Hemiplegia and hemiparesis following cerebral infarction affecting left dominant side: Secondary | ICD-10-CM

## 2023-07-25 DIAGNOSIS — T07XXXA Unspecified multiple injuries, initial encounter: Secondary | ICD-10-CM

## 2023-07-25 LAB — POC URINALSYSI DIPSTICK (AUTOMATED)
Bilirubin, UA: NEGATIVE
Blood, UA: NEGATIVE
Glucose, UA: NEGATIVE
Ketones, UA: NEGATIVE
Leukocytes, UA: NEGATIVE
Nitrite, UA: NEGATIVE
Protein, UA: POSITIVE — AB
Spec Grav, UA: >=1.03 — AB (ref 1.010–1.025)
Urobilinogen, UA: 0.2 U/dL
pH, UA: 5.5 (ref 5.0–8.0)

## 2023-07-25 MED ORDER — FLUOCINOLONE ACETONIDE 0.01 % OT OIL: 5.0000 [drp] | TOPICAL_OIL | Freq: Two times a day (BID) | OTIC | 6 refills | Status: AC | PRN

## 2023-07-25 NOTE — Patient Instructions (Addendum)
 [facility] Address: [address], [place], [place] [address] Phone: [phone]  Call the above location to get ultrasound of bladder performed.  Start using fluocinolone oil to your ears to help with psoriasis. Ovace contains sulfa so cannot be used.  Return to see me in 3 months. At that time I will be in a new location: [facility] at [facility] [address], [place], [place] [address] Phone: [phone]

## 2023-07-25 NOTE — Progress Notes (Signed)
 Established Patient Office Visit  Subjective:  Patient ID: Heather Luna, female    DOB: 04-02-1950  Age: 73 y.o. MRN: [id number]  Chief Complaint  Patient presents with   Hospitalization Follow-up    Pt was admitted to the hospital about 2 weeks ago. Due to a fall. Pts daughters said she has had multiple falls recently.     HPI  Discussed the use of AI scribe software for clinical note transcription with the patient, who gave verbal consent to proceed.  History of Present Illness   Heather Luna is a 73 year old female who presents with recurrent falls and urinary symptoms. She is accompanied by her daughter, Heather Luna, who is her primary caregiver.  She has experienced multiple falls over the past few weeks. Two weeks ago, she fell in her kitchen, resulting in a head injury that required staples. She also fell at the end of March, which led to a Colles' fracture of the wrist. An x-ray confirmed the fracture, and she was placed in a cast for six weeks. However, recent follow-up indicated that the fracture is not healing as expected, and she requires an additional five weeks in the cast. She reports frequent falls, particularly in the bathroom, often tripping over the threshold between the hallway and bathroom. Additional complications resulting in her falls are a brace on her left leg that appears to be malfunctioning, and a cane that is possibly not the right side/ support level. PT has requested a side walker and a new brace.  She has a history of urinary symptoms, including frequent urination, urgency, and the sensation of incomplete bladder emptying. She urinates four to five times at night and experiences urgency that requires her to be near a bathroom. She has previously been evaluated for urinary retention, and her daughter notes that she often needs to return to the bathroom shortly after urinating. A recent urine sample was taken to rule out infection, but initial results appear  normal.  She has a history of psoriasis, which causes significant itching, particularly in her ears. Also on her scalp. She has tried various treatments without success and reports that the itch is 'terrible'.  Her current medications include metformin  500 mg twice a day, Lamictal  75 mg twice a day, lactulose , Agrinox, Celexa , Lipitor, and vitamin D . She recently resumed taking lactulose  after a brief discontinuation due to stomach upset. Her daughter assists with her medication management and daily activities, including applying her brace.      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) 01/02/2023   Hepatic cirrhosis (HCC) 01/02/2023   Recurrent falls 01/02/2023   History of seizure disorder 01/02/2023   Encephalomalacia with cerebral infarction (HCC) 01/02/2023   Recurrent UTI 09/24/2013   Absence of bladder continence 04/23/2012   Closed fracture of phalanx of foot 02/20/2012   Epilepsy (HCC) 07/03/2011   CVA (cerebral vascular accident) (HCC) 07/26/2005   Past Medical History:  Diagnosis Date   Anxiety    Arthritis    Cataract    Chronic UTI    Depression    Diabetes mellitus without complication (HCC)    Seizures (HCC)    Stroke [facility])    Past Surgical History:  Procedure  Laterality Date   ABDOMINAL HYSTERECTOMY     APPENDECTOMY     BREAST SURGERY     left breast   CHOLECYSTECTOMY     EYE SURGERY     left cataract   FRACTURE SURGERY     R foot   MASTECTOMY Left 2007   Social History   Tobacco Use   Smoking status: Never   Smokeless tobacco: Never  Vaping Use   Vaping status: Never Used  Substance Use Topics   Alcohol use: Never   Drug use: Never      ROS: as  noted in HPI  Objective:     BP 114/71   Pulse 95   Wt 146 lb (66.2 kg)   SpO2 95%   BMI 26.70 kg/m  BP Readings from Last 3 Encounters:  07/25/23 114/71  07/01/23 106/61  04/25/23 95/62   Wt Readings from Last 3 Encounters:  07/25/23 146 lb (66.2 kg)  04/25/23 145 lb (65.8 kg)  03/21/23 150 lb (68 kg)      Physical Exam Vitals and nursing note reviewed. Chaperone present: daughter.  Constitutional:      General: She is not in acute distress.    Appearance: Normal appearance. She is not ill-appearing, toxic-appearing or diaphoretic.  HENT:     Head: Normocephalic and atraumatic.     Nose: Nose normal.     Mouth/Throat:     Mouth: Mucous membranes are moist.     Pharynx: Oropharynx is clear. No oropharyngeal exudate or posterior oropharyngeal erythema.  Eyes:     General: No scleral icterus.       Right eye: No discharge.        Left eye: No discharge.     Extraocular Movements: Extraocular movements intact.     Pupils: Pupils are equal, round, and reactive to light.  Neck:     Thyroid: No thyroid mass, thyromegaly or thyroid tenderness.  Cardiovascular:     Rate and Rhythm: Normal rate and regular rhythm.     Pulses: Normal pulses.  Pulmonary:     Effort: Pulmonary effort is normal. No respiratory distress.     Breath sounds: Normal breath sounds. No stridor. No wheezing or rhonchi.  Musculoskeletal:        General: Deformity (L arm/hand contracture) present.     Cervical back: Neck supple. No rigidity or tenderness.     Right lower leg: No edema.     Left lower leg: No edema.     Comments: Pt with chronic hemiplegia on L Pt in left leg brace Chronic contracture to L arm/ hand  Lymphadenopathy:     Cervical: No cervical adenopathy.  Skin:    General: Skin is warm and dry.     Coloration: Skin is pale.     Findings: No bruising, erythema or rash.  Neurological:     General: No focal deficit present.     Mental Status: She is alert and oriented to person,  place, and time.     Sensory: No sensory deficit.     Motor: Weakness (chronic L sided weakness.) present.     Gait: Gait abnormal (slow, shuffling gait. Abnormal foot placement on L secondary to bracing).  Psychiatric:        Mood and Affect: Mood normal.        Behavior: Behavior normal.     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  Dispense: 20 mL; Refill: 6  Encephalomalacia with cerebral infarction (HCC) -     Lipid panel  Fractures involving multiple body regions -     DG Bone Density; Future  Assessment and Plan    Frequent falls Falls primarily occur in the bathroom due to a threshold. Recent fall resulted in head injury. Balance and safety concerns at home. - Evaluate home environment for fall hazards, particularly the bathroom threshold; Consider installation of a ramp or removal of the threshold to prevent tripping. - physical therapy recently requested side walker and new brace; orders signed. - Consider a bedside commode to reduce bathroom trips, although she previously found it unstable and refused to use it.  Urinary retention Symptoms suggest urinary retention. Previous  bladder issues post-hysterectomy. Potential contribution to falls and UTIs. Flomax considered pending evaluation. - Order renal ultrasound (pre/post void residual) to assess for urinary retention. - Send urine sample for culture to rule out infection. - Consider starting Flomax after ultrasound results to address urinary retention.  Colles' fracture of wrist Fracture diagnosed via x-ray. Orthopedic follow-up indicated suboptimal healing, requiring additional immobilization. Concerns about brace condition. - Continue with current immobilization for five more weeks as per orthopedic recommendation. - Follow up with orthopedics for reassessment of fracture healing. - Ensure the brace is in good condition and properly fitted.  Psoriasis with itching Psoriasis with significant itching, particularly on scalp and ears. Previous treatments ineffective or too expensive. Discussed potential treatments including steroid shampoos and otic oils. Consideration of cost-effective alternatives due to sulfa allergy. - Prescribe fluocinolone  otic oil for ear psoriasis. - Investigate alternative scalp treatments that do not contain sulfa due to allergy.  DM Pt remains on metformin  twice daily. Due for repeat A1C  Thrombocytopenia Due for repeat labs today. This is secondary to cirrhosis. Monitor. No bleeding         Return in about 3 months (around 10/25/2023).   Mandy Second, PA

## 2023-07-26 LAB — CBC WITH DIFFERENTIAL/PLATELET
Basophils Absolute: 0.1 10*3/uL (ref 0.0–0.1)
Basophils Relative: 0.9 % (ref 0.0–3.0)
Eosinophils Absolute: 0 10*3/uL (ref 0.0–0.7)
Eosinophils Relative: 0.4 % (ref 0.0–5.0)
HCT: 35.1 % — ABNORMAL LOW (ref 36.0–46.0)
Hemoglobin: 11.4 g/dL — ABNORMAL LOW (ref 12.0–15.0)
Lymphocytes Relative: 8.4 % — ABNORMAL LOW (ref 12.0–46.0)
Lymphs Abs: 0.5 10*3/uL — ABNORMAL LOW (ref 0.7–4.0)
MCHC: 32.4 g/dL (ref 30.0–36.0)
MCV: 92.5 fl (ref 78.0–100.0)
Monocytes Absolute: 0.4 10*3/uL (ref 0.1–1.0)
Monocytes Relative: 5.6 % (ref 3.0–12.0)
Neutro Abs: 5.4 10*3/uL (ref 1.4–7.7)
Neutrophils Relative %: 84.7 % — ABNORMAL HIGH (ref 43.0–77.0)
Platelets: 86 10*3/uL — ABNORMAL LOW (ref 150.0–400.0)
RBC: 3.79 Mil/uL — ABNORMAL LOW (ref 3.87–5.11)
RDW: 17.1 % — ABNORMAL HIGH (ref 11.5–15.5)
WBC: 6.4 10*3/uL (ref 4.0–10.5)

## 2023-07-26 LAB — COMPREHENSIVE METABOLIC PANEL WITH GFR
ALT: 16 U/L (ref 0–35)
AST: 22 U/L (ref 0–37)
Albumin: 4.4 g/dL (ref 3.5–5.2)
Alkaline Phosphatase: 97 U/L (ref 39–117)
BUN: 17 mg/dL (ref 6–23)
CO2: 28 meq/L (ref 19–32)
Calcium: 9.2 mg/dL (ref 8.4–10.5)
Chloride: 105 meq/L (ref 96–112)
Creatinine, Ser: 0.85 mg/dL (ref 0.40–1.20)
GFR: 68.78 mL/min (ref >60.00)
Glucose, Bld: 178 mg/dL — ABNORMAL HIGH (ref 70–99)
Potassium: 4.2 meq/L (ref 3.5–5.1)
Sodium: 142 meq/L (ref 135–145)
Total Bilirubin: 0.5 mg/dL (ref 0.2–1.2)
Total Protein: 6.6 g/dL (ref 6.0–8.3)

## 2023-07-26 LAB — HEMOGLOBIN A1C: Hgb A1c MFr Bld: 6.8 % — ABNORMAL HIGH (ref 4.6–6.5)

## 2023-07-26 LAB — LIPID PANEL
Cholesterol: 113 mg/dL (ref 0–200)
HDL: 46.6 mg/dL (ref >39.00)
LDL Cholesterol: 49 mg/dL (ref 0–99)
NonHDL: 66.48
Total CHOL/HDL Ratio: 2
Triglycerides: 86 mg/dL (ref 0.0–149.0)
VLDL: 17.2 mg/dL (ref 0.0–40.0)

## 2023-07-26 LAB — TSH: TSH: 0.78 u[IU]/mL (ref 0.35–5.50)

## 2023-07-28 ENCOUNTER — Encounter: Payer: Self-pay | Admitting: Urgent Care

## 2023-07-28 DIAGNOSIS — T7840XA Allergy, unspecified, initial encounter: Secondary | ICD-10-CM

## 2023-07-28 DIAGNOSIS — B965 Pseudomonas (aeruginosa) (mallei) (pseudomallei) as the cause of diseases classified elsewhere: Secondary | ICD-10-CM

## 2023-07-28 DIAGNOSIS — N390 Urinary tract infection, site not specified: Secondary | ICD-10-CM

## 2023-07-28 LAB — URINE CULTURE
MICRO NUMBER:: 16430903
SPECIMEN QUALITY:: ADEQUATE

## 2023-07-28 IMAGING — CT CT CERVICAL SPINE W/O CM
3 of 4 series · 11 of 34 positions shown, 13 images · non-contrast
Comparison: Head CT 06/24/2020.  No prior cervical spine CT.

CLINICAL DATA: 70-year-old female with history of trauma from a
fall. Patient is on blood thinners.



[Series 8: sag bone · sagittal · 0.28mm/px · 5 of 82 slices shown, 6 images]
[im 28/82  bone]
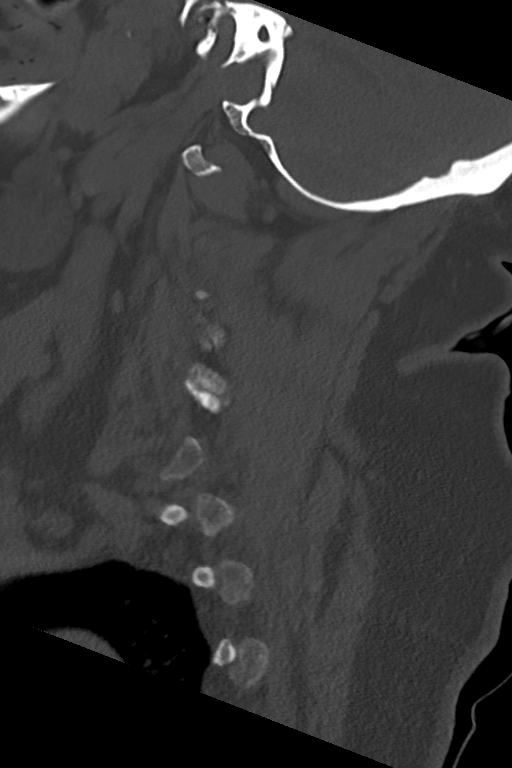
[im 34/82  bone]
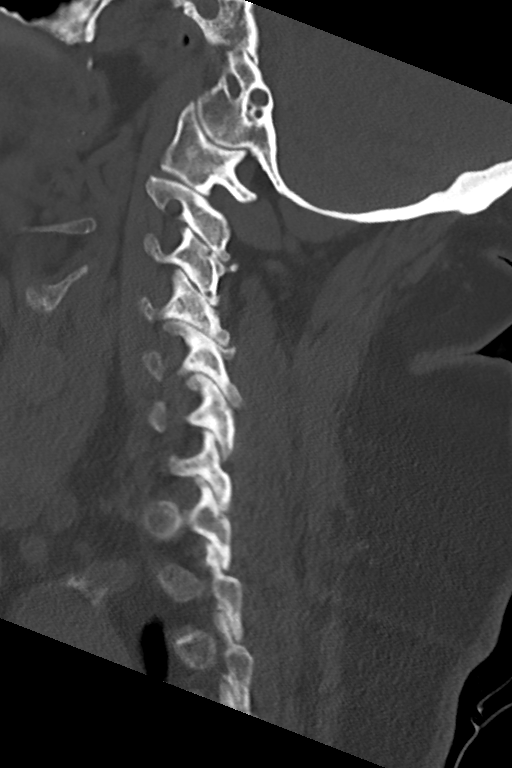
[im 41/82  soft-tissue]
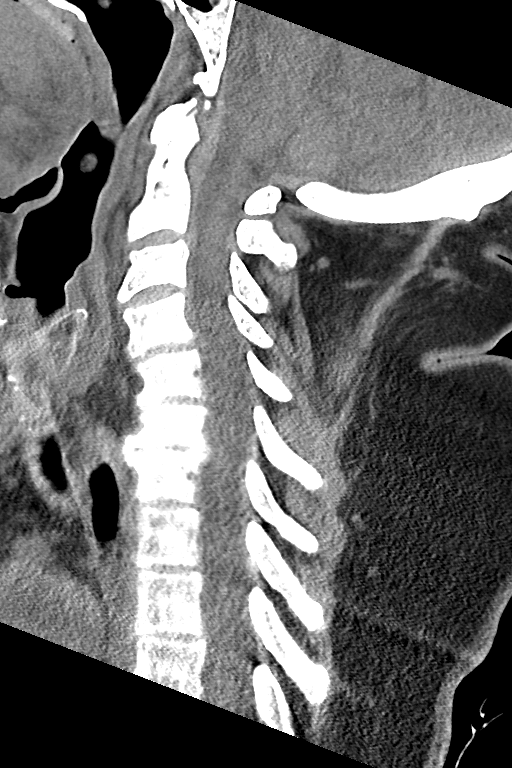
[im 41/82  bone]
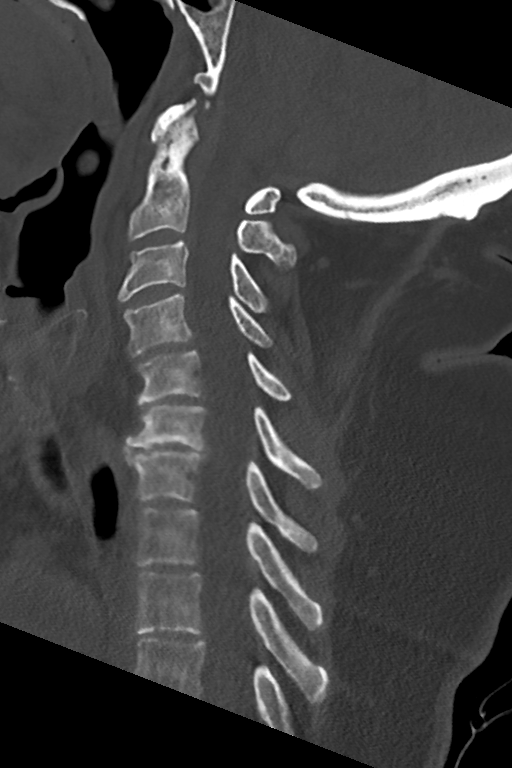
[im 48/82  bone]
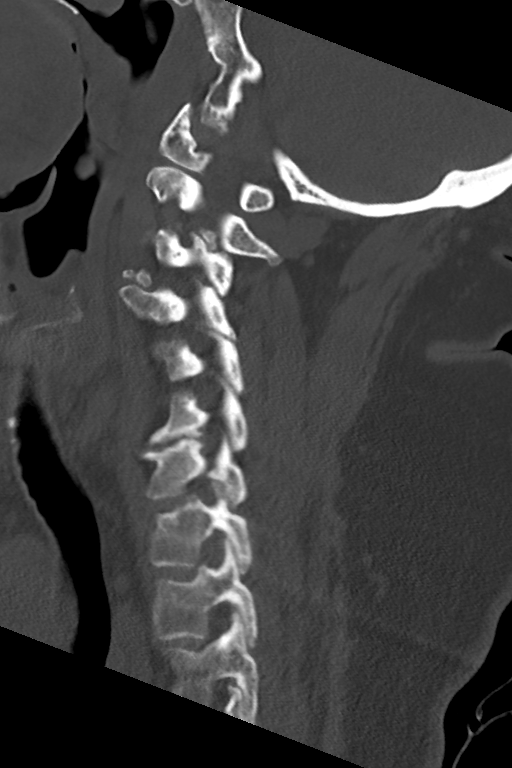
[im 55/82  bone]
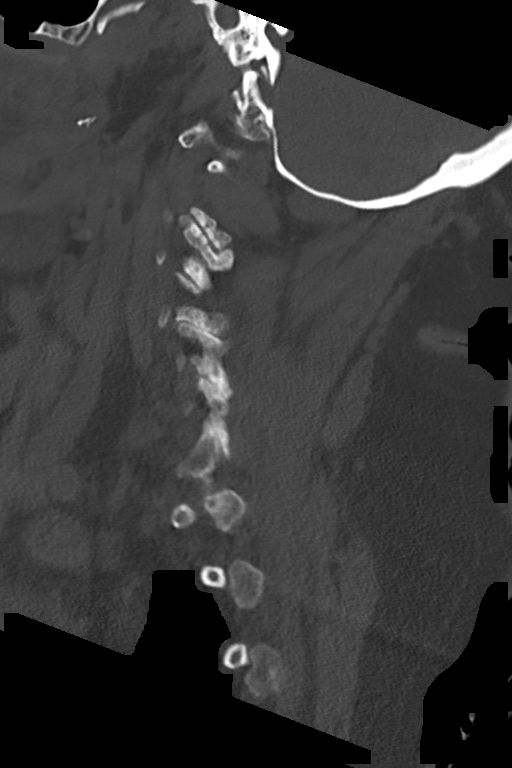

[Series 9: cor bone · coronal · 0.27mm/px · 3 of 55 slices shown]
[im 11/55  bone]
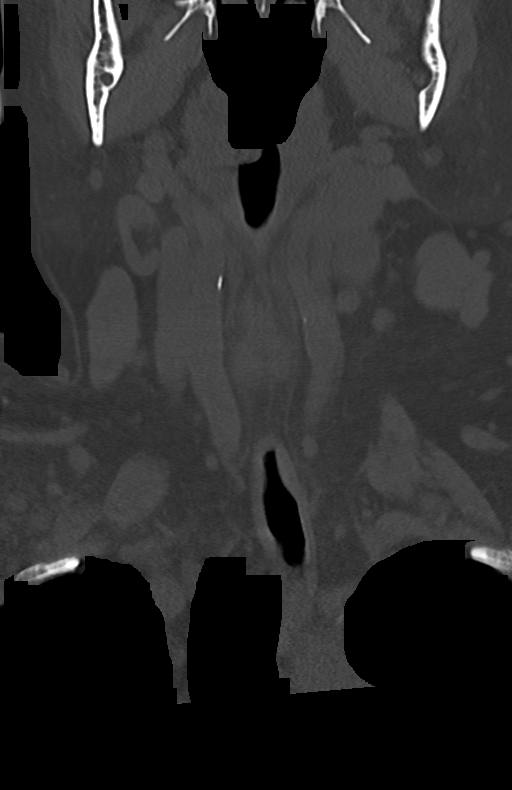
[im 22/55  bone]
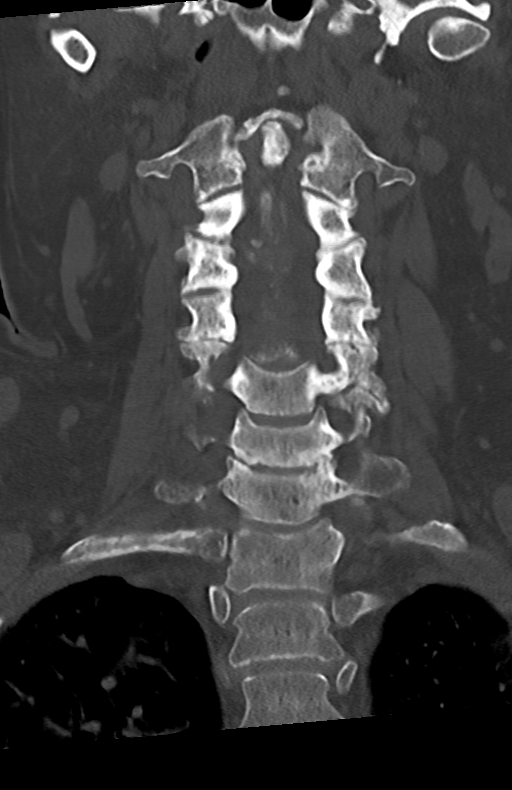
[im 33/55  bone]
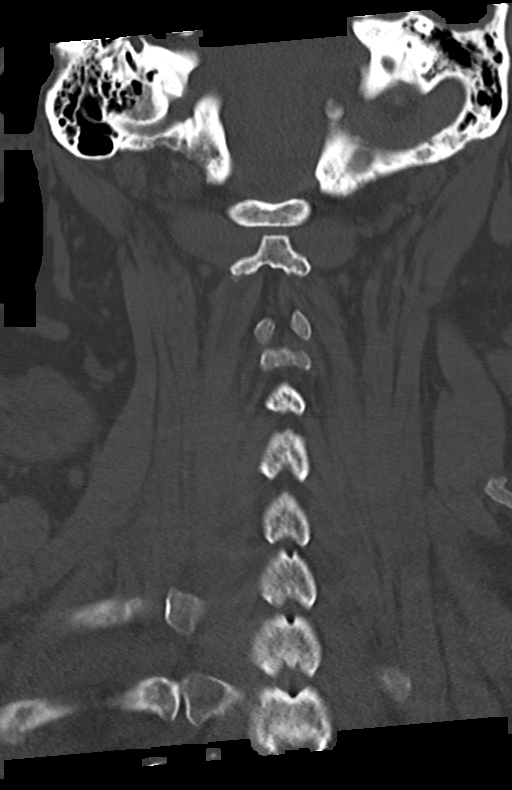

[Series 10: orthogonal axials · axial · 0.21mm/px · z∈[+1229,+1340]mm · 3 of 92 slices shown, 4 images]
[im 16/92  soft-tissue]
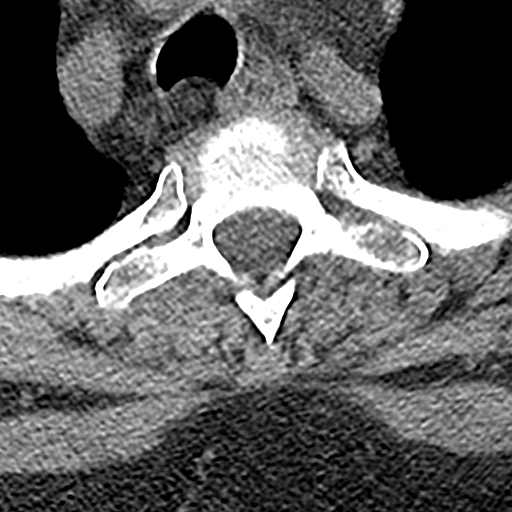
[im 16/92  bone]
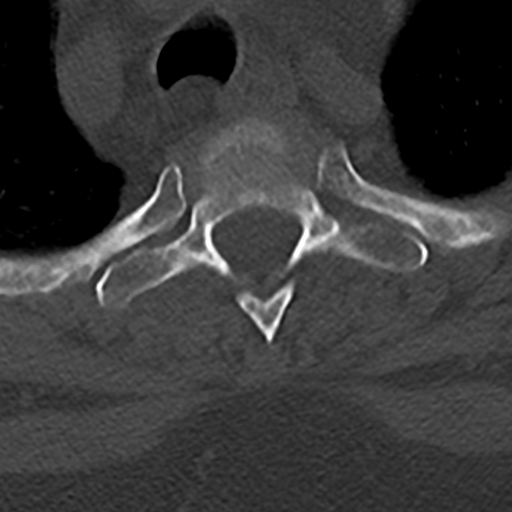
[im 46/92  bone]
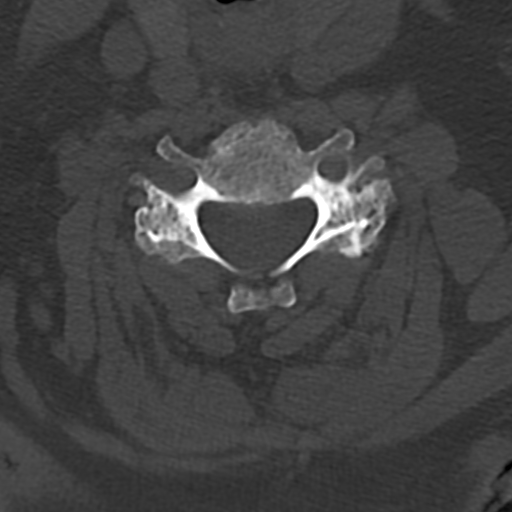
[im 76/92  bone]
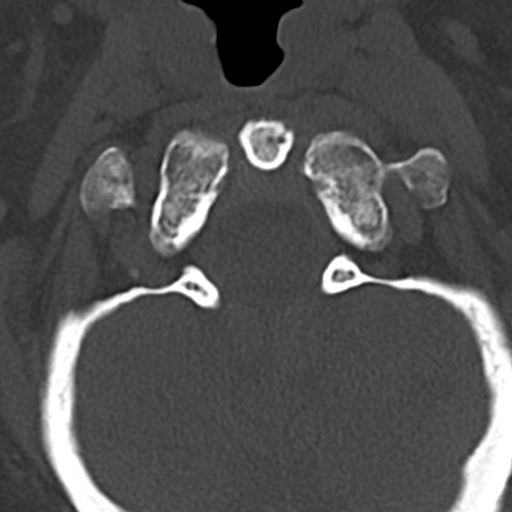

[11 of 34 positions shown; findings below may reference images not displayed]

FINDINGS: CT HEAD FINDINGS

Brain: Mild cerebral and cerebellar atrophy. Extensive
low-attenuation throughout the medial right cerebral hemisphere,
with slightly increased involvement throughout the right
parieto-occipital region when compared to the prior study, with
overlying cortical volume loss, indicative of
encephalomalacia/gliosis from prior infarct(s). This is associated
with some ex vacuo dilatation of the right lateral ventricle.
Well-defined foci of low attenuation also noted in the left caudate,
inferior left putamen and left cerebellar hemisphere, indicative old
lacunar infarcts. No evidence of acute infarction, hemorrhage,
hydrocephalus, extra-axial collection or mass lesion/mass effect.

Vascular: No hyperdense vessel or unexpected calcification.

Skull: Normal. Negative for fracture or focal lesion.

Sinuses/Orbits: No acute finding.

Other: Small amount of soft tissue swelling and high attenuation in
the left parieto-occipital scalp, indicative of a scalp
contusion/hematoma.

CT CERVICAL SPINE FINDINGS

Alignment: Reversal of normal cervical lordosis centered at the
level of C5, likely chronic and degenerative. Alignment is otherwise
anatomic.

Skull base and vertebrae: No acute fracture. No primary bone lesion
or focal pathologic process.

Soft tissues and spinal canal: No prevertebral fluid or swelling. No
visible canal hematoma.

Disc levels: Multilevel degenerative disc disease, most severe at
C6-C7. Severe multilevel bilateral facet arthropathy.

Upper chest: Unremarkable.

Other: None.
IMPRESSION: 1. Small left parieto-occipital scalp contusion/hematoma, without
evidence of significant acute traumatic injury to the skull, brain
or cervical spine.
2. Mild cerebral and cerebellar atrophy with chronic microvascular
ischemic changes in the cerebral white matter, multiple old lacunar
infarcts, and extensive encephalomalacia/gliosis throughout the
right cerebral hemisphere, which has slightly worsened in the
parieto-occipital region when compared to the prior study.
3. Multilevel degenerative disc disease and cervical spondylosis, as
above.

## 2023-07-28 MED ORDER — CIPROFLOXACIN HCL 500 MG PO TABS: 500.0000 mg | ORAL_TABLET | Freq: Two times a day (BID) | ORAL | 0 refills | Status: AC

## 2023-08-02 ENCOUNTER — Ambulatory Visit (HOSPITAL_BASED_OUTPATIENT_CLINIC_OR_DEPARTMENT_OTHER)
Admission: RE | Admit: 2023-08-02 | Discharge: 2023-08-02 | Disposition: A | Discharge status: Home / Self Care | Source: Ambulatory Visit | Attending: Urgent Care | Admitting: Urgent Care

## 2023-08-02 ENCOUNTER — Encounter: Payer: Self-pay | Admitting: Urgent Care

## 2023-08-02 DIAGNOSIS — R350 Frequency of micturition: Secondary | ICD-10-CM | POA: Insufficient documentation

## 2023-08-02 DIAGNOSIS — R339 Retention of urine, unspecified: Secondary | ICD-10-CM | POA: Insufficient documentation

## 2023-08-02 DIAGNOSIS — N1330 Unspecified hydronephrosis: Secondary | ICD-10-CM | POA: Insufficient documentation

## 2023-08-02 MED ORDER — AZITHROMYCIN 250 MG PO TABS: ORAL_TABLET | ORAL | 0 refills | Status: AC

## 2023-08-02 MED ORDER — PREDNISONE 50 MG PO TABS: 50.0000 mg | ORAL_TABLET | Freq: Every day | ORAL | 0 refills | Status: DC

## 2023-08-03 IMAGING — DX DG SHOULDER 2+V*L*
3 series · 3 of 3 positions shown · non-contrast
Comparison: None Available.

CLINICAL DATA: Fall last week.  Shoulder pain.

EXAM:
LEFT SHOULDER - 2+ VIEW

[shoulder grashey (1 of 2)]
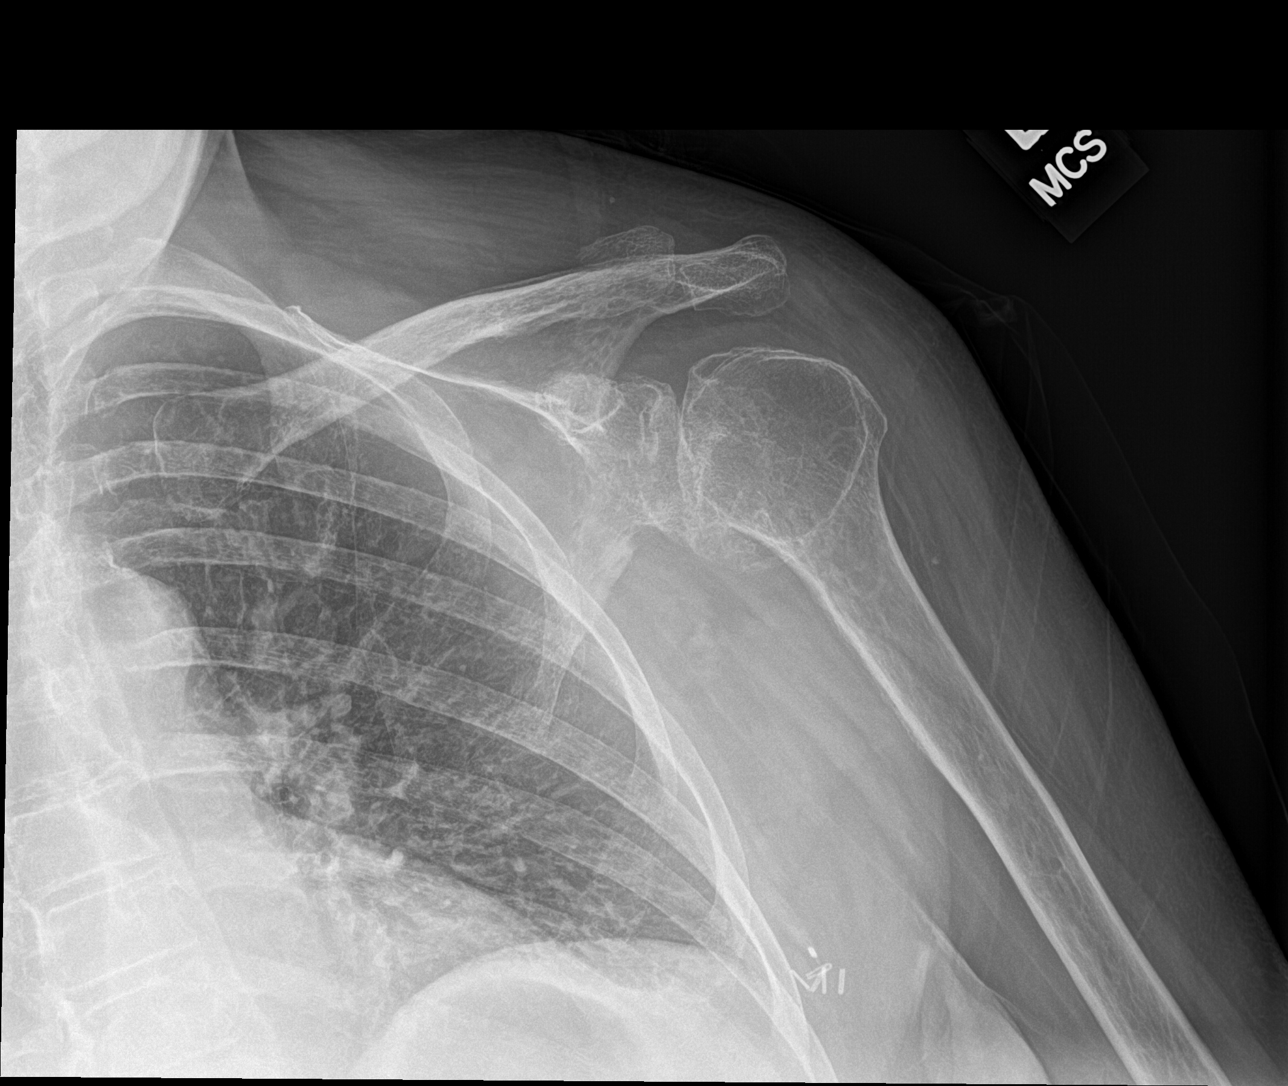

[shoulder y view]
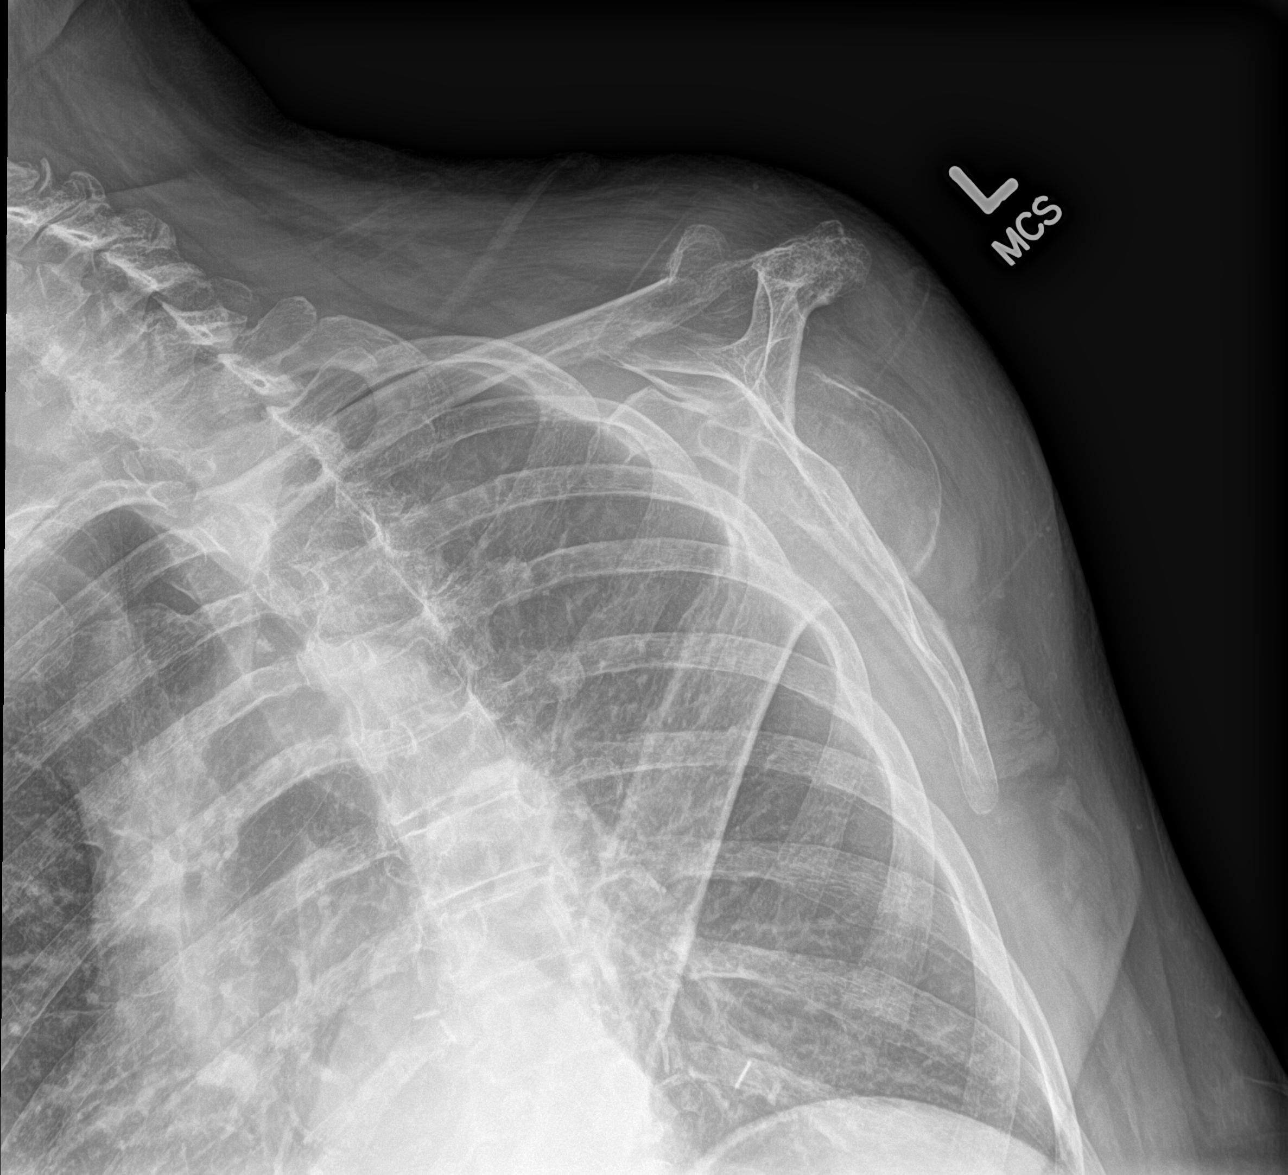

[shoulder grashey (2 of 2)]
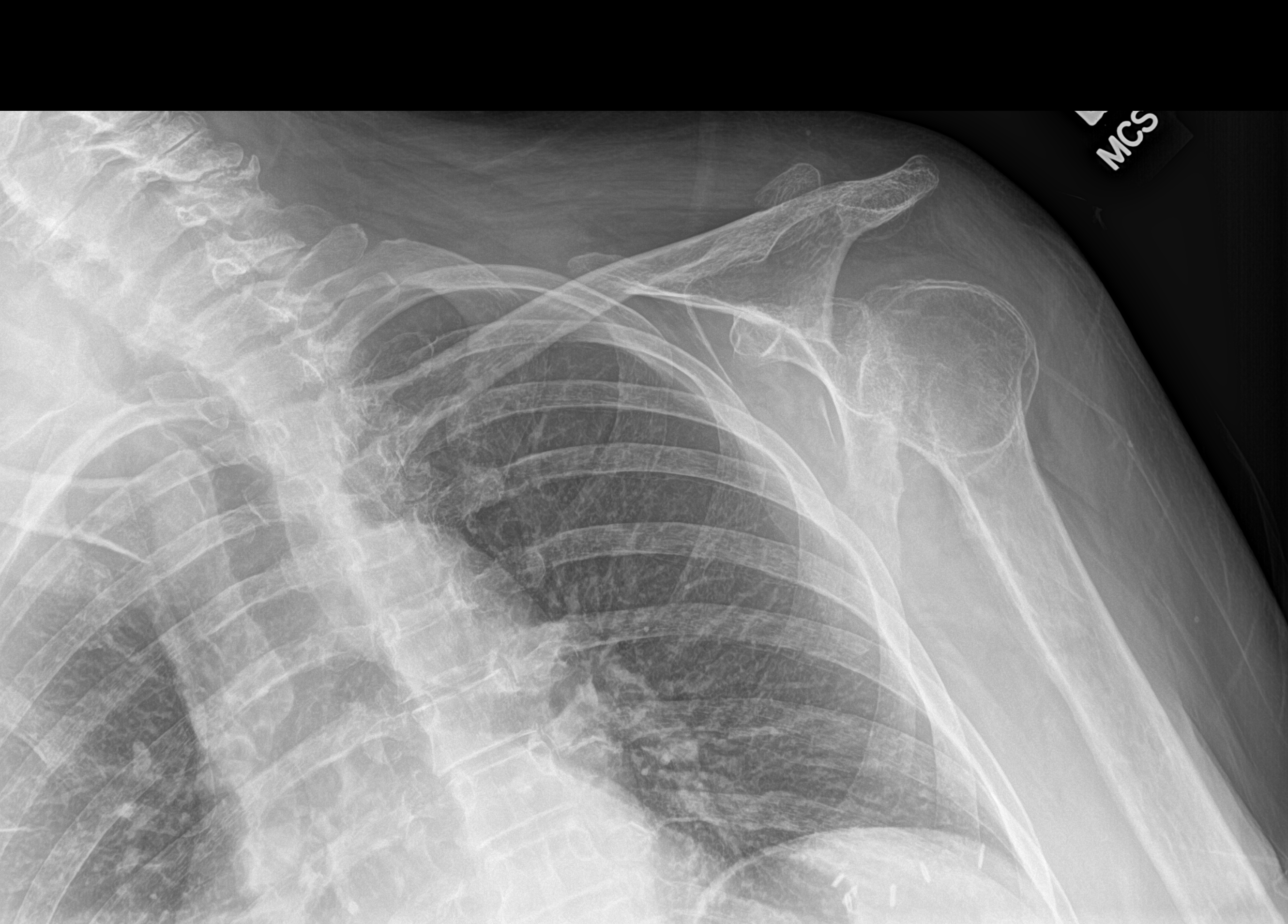

[3 of 3 positions shown; findings below may reference images not displayed]

FINDINGS: There is diffuse decreased bone mineralization. Moderate to severe
inferior glenohumeral joint space narrowing and peripheral
osteophytes. There is moderate medial humeral head cortical
flattening/remodeling likely from chronic degenerative change.

Moderate left acromioclavicular joint space narrowing and peripheral
osteophytosis. There appears to be approximate 4 mm cortical
step-off of the lateral aspect of the clavicle, mildly displaced
fracture near the clavicular head. There is mild up turning of the
clavicular head at the acromioclavicular joint but otherwise no
dislocation or separation.

There is very low density within the proximal humeral diaphysis
which limits evaluation for an acute fracture. There is an oblique
linear lucency overlying this portion of the bone on frontal view
which may represent an acute fracture versus overlying soft tissue
markings. On oblique view, there are lucencies overlying the bone
and the overlying lung in this same region. Question mild proximal
medial humeral diaphyseal periosteal thickening, possibly from
partial healing of a nondisplaced fracture.
IMPRESSION: 1. Acute mildly displaced fracture of the lateral aspect of the
clavicular head.
2. Decreased bone mineralization markedly limits evaluation for
acute fractures, and it is difficult to exclude an acute
nondisplaced fracture with possible healing periosteal reaction
within the proximal humeral diaphysis. Recommend correlation for
point tenderness.

## 2023-08-05 ENCOUNTER — Ambulatory Visit: Payer: Self-pay | Admitting: Urgent Care

## 2023-08-05 DIAGNOSIS — R339 Retention of urine, unspecified: Secondary | ICD-10-CM

## 2023-08-05 DIAGNOSIS — N390 Urinary tract infection, site not specified: Secondary | ICD-10-CM

## 2023-08-05 MED ORDER — TAMSULOSIN HCL 0.4 MG PO CAPS: 0.4000 mg | ORAL_CAPSULE | Freq: Every day | ORAL | 3 refills | Status: DC

## 2023-08-05 MED ORDER — METHENAMINE HIPPURATE 1 G PO TABS: 1.0000 g | ORAL_TABLET | Freq: Two times a day (BID) | ORAL | 3 refills | Status: DC

## 2023-08-08 NOTE — Progress Notes (Deleted)
 NEUROLOGY FOLLOW UP OFFICE NOTE  DRINA JOBST [id number]  Subjective:  Heather Luna is a 73 y.o. year old left-handed female with a medical history of stroke (1995 and 2007, right hemisphere) c/b seizure and left sided spasticity, DM, HLD, breast cancer, cirrhosis c/b thrombocytopenia who we last saw on 02/13/23 for stroke and seizure.  To briefly review: 02/13/23: Patient had a stroke in 1995. She has previously been seen by neurology at [facility] in 2019. 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 Her most recent MRI of the brain showed expected evolution of right ACA, right MCA, and bilateral (L>R) cerebellar/PICA infarcts. The most recent MRA head showed occlusion of the right A2 segment of the ACA, abnormalities of the right distal ACA. She also has a history of complex partial seizures for which she takes lamotrigine . She is referred for questions regarding her antiplatelet therapy. One of her strokes occurred while she was taking aspirin  and clopidogrel, and she has been maintained on aspirin -dipyridamole  ever since.   Medical co-morbidities noted to be contributing to the patient's stroke risk include the following: hyperlipidemia, prior strokes  Interval history:  Obtained history from: patient Patient caretaker: n/a (daughter helps out) Ms. Tool presents to [facility] today, accompanied by her daughter. During today's encounter, the the following history is added:   She endorses a history of what she describes as TIAs, most recently in 2012 with unusual behaviors (playing with dish towels, for instance), but none in the last 4-5 years. Regarding her antiplatelet medication, she was initially taking clopidogrel after her stroke in  1995, and then was taken off this medication for about 10 days or so prior to her surgery. After the second stroke she was started on Aggrenox , which she continues to take. However, the branded drug has become too expensive and the question is whether should she should switch to the generic brand.   The patient is interested in restarting PT. 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 She wears a brace due to foot drop on that side. She endorses pain at night in the left ankle and foot, as if someone were pulling on it.    At one point patient was evaluated in a [place]  ED and started on Lamictal  due to concern for seizures. Patient was found in the kitchen confused by her sister. She had EEG and per patient, they thought it was a "mini-stroke". Per patient Lamictal  was started in [place] when she returned. Patient has been on this for about 10 years. She is on 75 mg twice daily. There has been no concern for seizure since.    Daughter has some concerns about her mothers decision making and safety. She has spent money she does not have. She has significant weakness and spasticity of her left side. She has frequent falls, about 1 time per week. It usually happens when she is bending over and will lose her balance. She denies numbness, tingling, or pain.    She is currently on aggrenox  25-200 mg twice daily and atorvastatin  40 mg daily. Of note, on B complex.  Most recent Assessment and Plan (02/13/23): Heather Luna  Heather Luna is a 73 y.o. female who presents for history of stroke c/b left sided weakness, numbness, and spasticity, possible history of seizures, and imbalance and falls. She has a relevant medical history of stroke (1995 and 2007, right hemisphere) c/b seizure and left sided spasticity, DM, HLD, breast cancer, cirrhosis c/b thrombocytopenia. 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 Available diagnostic data is significant for CT head showing extensive encephalomalacia in right ACA and MCA territories 2/2 prior infarct. Her left sided weakness, numbness, and spasticity are residuals from her prior stroke. We discussed treatment for spasticity including medication (such as baclofen) or botox. I am concerned a medication like baclofen may cause more generalized weakness and could contribute to her imbalance. Botox would likely be a better option. Patient would prefer to complete more PT and then decide if botox is needed.    In terms of her seizure history, this is unclear to me. Her previous neurologist mentions a history of partial seizures, but patient only recalls one episode of confusion in the past. She has been on Lamictal  for ~10 years without concern for additional episodes. We agreed to get EEG then discuss whether tapering Lamictal  makes sense.   PLAN: -1 hour EEG -Continue Lamictal  75 mg BID for now -Continue Aggrenox  -Continue atorvastatin  40 mg daily -Continue PT/OT for home health; will send a new referral if needed. Patient to discuss with PT -Discussed botox for spasticity, patient would like to continue try more PT first  Since their last visit: EEG showed focal slowing over right hemisphere. I called and messaged daughter as requested but did not get a response. I recommend that patient continue lamictal  due to EEG findings.  ***  MEDICATIONS:  Outpatient Encounter Medications as of 08/16/2023  Medication Sig   atorvastatin  (LIPITOR) 40 MG tablet Take 1 tablet (40 mg total) by mouth at bedtime.   b complex vitamins tablet Take 1 tablet by mouth daily.   citalopram  (CELEXA ) 20 MG tablet Take 1 tablet (20 mg total) by mouth daily.   CRANBERRY PO Take 1 tablet by mouth daily.   dipyridamole -aspirin  (AGGRENOX ) 200-25 MG 12hr capsule Take 1 capsule by mouth  2 (two) times daily.   Fluocinolone  Acetonide 0.01 % OIL Place 5 drops in ear(s) 2 (two) times daily as needed.   lactulose  (CHRONULAC ) 10 GM/15ML solution Take 15-30 ml up to twice daily for constipation and confusion from cirrhosis, aim for 2 bowel movements a day   lamoTRIgine  (LAMICTAL ) 25 MG tablet Take 3 tablets (75 mg total) by mouth 2 (two) times daily.   metFORMIN  (GLUCOPHAGE ) 500 MG tablet Take 1 tablet (500 mg total) by mouth 2 (two) times daily with a meal.   methenamine  (HIPREX ) 1 g tablet Take 1 tablet (1 g total) by mouth 2 (two) times daily with a meal.   nystatin  (MYCOSTATIN /NYSTOP ) powder Apply 1 Application topically 3 (three) times daily.   predniSONE  (DELTASONE ) 50 MG tablet Take 1 tablet (50 mg total) by mouth daily with breakfast.   tamsulosin  (FLOMAX ) 0.4 MG CAPS capsule Take 1 capsule (0.4 mg total) by mouth daily.   No facility-administered encounter medications on file as of 08/16/2023.    PAST MEDICAL HISTORY: Past Medical History:  Diagnosis Date   Anxiety    Arthritis    Cataract    Chronic UTI    Depression    Diabetes mellitus without complication (HCC)  Seizures (HCC)    Stroke (HCC)     PAST SURGICAL HISTORY: Past Surgical History:  Procedure Laterality Date   ABDOMINAL HYSTERECTOMY     APPENDECTOMY     BREAST SURGERY     left breast   CHOLECYSTECTOMY     EYE SURGERY     left cataract   FRACTURE SURGERY     R foot   MASTECTOMY Left 2007    ALLERGIES: Allergies  Allergen Reactions   Septra [Sulfamethoxazole-Trimethoprim] Hives   Ciprofloxacin  Rash   Levetiracetam Other (See Comments)    Crying and irritabliity    FAMILY HISTORY: Family History  Problem Relation Age of Onset   Rheum arthritis Mother    Cancer Mother    Heart failure Father    Heart attack Father    Breast cancer Paternal Grandmother     SOCIAL HISTORY: Social History   Tobacco Use   Smoking status: Never   Smokeless tobacco: Never  Vaping Use   Vaping  status: Never Used  Substance Use Topics   Alcohol use: Never   Drug use: Never   Social History   Social History Narrative   Are you right handed or left handed? Left handed but is now right handed   Are you currently employed ?    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    What type of home do you live in: 1 story or 2 story? one    Caffeine 1/2 cup coffee a day      Objective:  Vital Signs:  There were no vitals taken for this visit.  ***  Labs and Imaging review: New results: 07/25/23: Lipid panel: tChol 113, LDL 49, TG 86.0 TSH wnl HbA1c: 6.8  EEG (02/22/23): Description: The patient is awake and asleep during the recording.  During maximal wakefulness, there is a symmetric, medium voltage 9 Hz posterior dominant rhythm that attenuates with eye opening.  There is continuous polymorphic theta and delta slowing over the right hemisphere, maximal over the right frontocentrotemporal regions, at times sharply contoured without clear epileptogenic potential. During drowsiness and sleep, there is an increase in theta slowing of the background. Vertex waves and symmetric sleep spindles were seen. Photic stimulation did not elicit any abnormalities.  There were no clear epileptiform discharges or electrographic seizures seen.     EKG lead was unremarkable.   Impression: This 1-hour awake and asleep EEG is abnormal due to focal slowing over the right hemisphere.   Clinical Correlation of the above findings indicates focal cerebral dysfunction over the right hemisphere consistent with prior stroke in this region. The absence of epileptiform discharges does not exclude a clinical diagnosis of epilepsy. Clinical correlation is advised.  CT head wo contrast (07/01/23): FINDINGS: Brain: Extensive right frontal lateral encephalomalacia is again noted with ex vacuo dilatation of the right lateral ventricle. Small old infarcts in  the bilateral cerebral hemispheres appear unchanged. There is mild periventricular white matter hypodensity, likely chronic small vessel ischemic change. There is no acute intracranial hemorrhage, extra-axial fluid collection, acute infarct or mass effect. There is no hydrocephalus.   Vascular: Atherosclerotic calcifications are present within the cavernous internal carotid arteries.   Skull: Normal. Negative for fracture or focal lesion.   Sinuses/Orbits: No acute finding.   Other: There is left parietal scalp soft tissue swelling and laceration.   IMPRESSION: 1. No acute intracranial process. 2. Left parietal scalp soft tissue  swelling and laceration. 3. Stable chronic changes.  CT cervical spine wo contrast (07/01/23): FINDINGS: Alignment: Mild reversal of the normal cervical lordosis is noted.   Skull base and vertebrae: 7 cervical segments are well visualized. Vertebral body height is well maintained. Mild osteophytic changes and facet hypertrophic changes are noted. No acute fracture or acute facet abnormality is noted.   Soft tissues and spinal canal: Surrounding soft tissue structures are within normal limits.   Upper chest: Visualized lung apices are within normal limits.   Other: None.   IMPRESSION: Multilevel degenerative changes without acute bony abnormality.  Previously reviewed results: Lab Results  Component Value Date    HGBA1C 6.3 (A) 01/02/2023    HGBA1C 6.3 01/02/2023    HGBA1C 6.3 01/02/2023    HGBA1C 6.3 01/02/2023      Recent Labs       Lab Results  Component Value Date    VITAMINB12 404 01/02/2023      Recent Labs[] Expand by Default       Lab Results  Component Value Date    TSH 1.15 01/02/2023      01/02/23: CBC w/ diff significant for thrombocytopenia (platelets 83 - chronic) CMP significant for glucose of 121 Folate wnl Lipid panel: Component     Latest Ref Rng 01/02/2023  Cholesterol     0 - 200 mg/dL 88    Triglycerides     0.0 - 149.0 mg/dL 161.0   HDL Cholesterol     >39.00 mg/dL 96.04 (L)   VLDL     0.0 - 40.0 mg/dL 54.0   LDL (calc)     0 - 99 mg/dL 30   Total CHOL/HDL Ratio 2   NonHDL 50.04       Imaging: CT head and cervical spine wo contrast (12/30/22): FINDINGS: CT HEAD FINDINGS   Brain: No acute hemorrhage. 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 Unchanged old perforator infarcts in the left caudate, right thalamus and bilateral cerebellar hemispheres. No hydrocephalus or extra-axial collection. No mass effect or midline shift.   Vascular: No hyperdense vessel or unexpected calcification.   Skull: No calvarial fracture or suspicious bone lesion. Skull base is unremarkable.   Sinuses/Orbits: No acute finding.   Other: None.   CT CERVICAL SPINE FINDINGS   Alignment: Unchanged 3 mm degenerative anterolisthesis of C4 on C5. No traumatic malalignment.   Skull base and vertebrae: No acute fracture. Normal craniocervical junction. No suspicious bone lesions.   Soft tissues and spinal canal: No prevertebral fluid or swelling. No visible canal hematoma.   Disc levels: Unchanged multilevel cervical spondylosis without high-grade spinal canal stenosis.   Upper chest: No acute findings.   Other: None.   IMPRESSION: 1. No acute intracranial abnormality. 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 2. No acute cervical spine fracture or traumatic malalignment.   CT head wo contrast (08/13/2018): IMPRESSION: 1. No acute intracranial abnormality detected. 2. Left posterior scalp swelling without evidence of a calvarial defect. 3. Large old right frontal lobe infarct. Probable old infarct involving the left cerebellum.  Assessment/Plan:  This is SHAYLYN BAWA, a 73 y.o. female with: ***   Plan: ***  Return to clinic in ***  Total time spent reviewing records,  interview, history/exam, documentation, and coordination of care on day of encounter:  *** min  Rommie Coats, MD

## 2023-08-16 ENCOUNTER — Ambulatory Visit: Payer: Medicare HMO | Admitting: Neurology

## 2023-08-16 ENCOUNTER — Ambulatory Visit: Admitting: Physician Assistant

## 2023-08-16 NOTE — Progress Notes (Deleted)
 NEUROLOGY FOLLOW UP OFFICE NOTE  Heather Luna [id number]  Subjective:  Heather Luna is a 73 y.o. year old left-handed female with a medical history of stroke (1995 and 2007, right hemisphere) c/b seizure and left sided spasticity, DM, HLD, breast cancer, cirrhosis c/b thrombocytopenia who we last saw on 02/13/23 for stroke and seizure.  To briefly review: 02/13/23: Patient had a stroke in 1995. She has previously been seen by neurology at [facility] in 2019. 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 Her most recent MRI of the brain showed expected evolution of right ACA, right MCA, and bilateral (L>R) cerebellar/PICA infarcts. The most recent MRA head showed occlusion of the right A2 segment of the ACA, abnormalities of the right distal ACA. She also has a history of complex partial seizures for which she takes lamotrigine . She is referred for questions regarding her antiplatelet therapy. One of her strokes occurred while she was taking aspirin  and clopidogrel, and she has been maintained on aspirin -dipyridamole  ever since.   Medical co-morbidities noted to be contributing to the patient's stroke risk include the following: hyperlipidemia, prior strokes  Interval history:  Obtained history from: patient Patient caretaker: n/a (daughter helps out) Heather Luna presents to [facility] today, accompanied by her daughter. During today's encounter, the the following history is added:   She endorses a history of what she describes as TIAs, most recently in 2012 with unusual behaviors (playing with dish towels, for instance), but none in the last 4-5 years. Regarding her antiplatelet medication, she was initially taking clopidogrel after her stroke in  1995, and then was taken off this medication for about 10 days or so prior to her surgery. After the second stroke she was started on Aggrenox , which she continues to take. However, the branded drug has become too expensive and the question is whether should she should switch to the generic brand.   The patient is interested in restarting PT. 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 She wears a brace due to foot drop on that side. She endorses pain at night in the left ankle and foot, as if someone were pulling on it.    At one point patient was evaluated in a [place]  ED and started on Lamictal  due to concern for seizures. Patient was found in the kitchen confused by her sister. She had EEG and per patient, they thought it was a "mini-stroke". Per patient Lamictal  was started in [place] when she returned. Patient has been on this for about 10 years. She is on 75 mg twice daily. There has been no concern for seizure since.    Daughter has some concerns about her mothers decision making and safety. She has spent money she does not have. She has significant weakness and spasticity of her left side. She has frequent falls, about 1 time per week. It usually happens when she is bending over and will lose her balance. She denies numbness, tingling, or pain.    She is currently on aggrenox  25-200 mg twice daily and atorvastatin  40 mg daily. Of note, on B complex.  Most recent Assessment and Plan (02/13/23): Heather Luna  Heather Luna is a 73 y.o. female who presents for history of stroke c/b left sided weakness, numbness, and spasticity, possible history of seizures, and imbalance and falls. She has a relevant medical history of stroke (1995 and 2007, right hemisphere) c/b seizure and left sided spasticity, DM, HLD, breast cancer, cirrhosis c/b thrombocytopenia. 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 Available diagnostic data is significant for CT head showing extensive encephalomalacia in right ACA and MCA territories 2/2 prior infarct. Her left sided weakness, numbness, and spasticity are residuals from her prior stroke. We discussed treatment for spasticity including medication (such as baclofen) or botox. I am concerned a medication like baclofen may cause more generalized weakness and could contribute to her imbalance. Botox would likely be a better option. Patient would prefer to complete more PT and then decide if botox is needed.    In terms of her seizure history, this is unclear to me. Her previous neurologist mentions a history of partial seizures, but patient only recalls one episode of confusion in the past. She has been on Lamictal  for ~10 years without concern for additional episodes. We agreed to get EEG then discuss whether tapering Lamictal  makes sense.   PLAN: -1 hour EEG -Continue Lamictal  75 mg BID for now -Continue Aggrenox  -Continue atorvastatin  40 mg daily -Continue PT/OT for home health; will send a new referral if needed. Patient to discuss with PT -Discussed botox for spasticity, patient would like to continue try more PT first  Since their last visit: EEG showed focal slowing over right hemisphere. I called and messaged daughter as requested but did not get a response. I recommend that patient continue lamictal  due to EEG findings.  ***  MEDICATIONS:  Outpatient Encounter Medications as of 08/21/2023  Medication Sig   atorvastatin  (LIPITOR) 40 MG tablet Take 1 tablet (40 mg total) by mouth at bedtime.   b complex vitamins tablet Take 1 tablet by mouth daily.   citalopram  (CELEXA ) 20 MG tablet Take 1 tablet (20 mg total) by mouth daily.   CRANBERRY PO Take 1 tablet by mouth daily.   dipyridamole -aspirin  (AGGRENOX ) 200-25 MG 12hr capsule Take 1 capsule by mouth  2 (two) times daily.   Fluocinolone  Acetonide 0.01 % OIL Place 5 drops in ear(s) 2 (two) times daily as needed.   lactulose  (CHRONULAC ) 10 GM/15ML solution Take 15-30 ml up to twice daily for constipation and confusion from cirrhosis, aim for 2 bowel movements a day   lamoTRIgine  (LAMICTAL ) 25 MG tablet Take 3 tablets (75 mg total) by mouth 2 (two) times daily.   metFORMIN  (GLUCOPHAGE ) 500 MG tablet Take 1 tablet (500 mg total) by mouth 2 (two) times daily with a meal.   methenamine  (HIPREX ) 1 g tablet Take 1 tablet (1 g total) by mouth 2 (two) times daily with a meal.   nystatin  (MYCOSTATIN /NYSTOP ) powder Apply 1 Application topically 3 (three) times daily.   predniSONE  (DELTASONE ) 50 MG tablet Take 1 tablet (50 mg total) by mouth daily with breakfast.   tamsulosin  (FLOMAX ) 0.4 MG CAPS capsule Take 1 capsule (0.4 mg total) by mouth daily.   No facility-administered encounter medications on file as of 08/21/2023.    PAST MEDICAL HISTORY: Past Medical History:  Diagnosis Date   Anxiety    Arthritis    Cataract    Chronic UTI    Depression    Diabetes mellitus without complication (HCC)  Seizures (HCC)    Stroke (HCC)     PAST SURGICAL HISTORY: Past Surgical History:  Procedure Laterality Date   ABDOMINAL HYSTERECTOMY     APPENDECTOMY     BREAST SURGERY     left breast   CHOLECYSTECTOMY     EYE SURGERY     left cataract   FRACTURE SURGERY     R foot   MASTECTOMY Left 2007    ALLERGIES: Allergies  Allergen Reactions   Septra [Sulfamethoxazole-Trimethoprim] Hives   Ciprofloxacin  Rash   Levetiracetam Other (See Comments)    Crying and irritabliity    FAMILY HISTORY: Family History  Problem Relation Age of Onset   Rheum arthritis Mother    Cancer Mother    Heart failure Father    Heart attack Father    Breast cancer Paternal Grandmother     SOCIAL HISTORY: Social History   Tobacco Use   Smoking status: Never   Smokeless tobacco: Never  Vaping Use   Vaping  status: Never Used  Substance Use Topics   Alcohol use: Never   Drug use: Never   Social History   Social History Narrative   Are you right handed or left handed? Left handed but is now right handed   Are you currently employed ?    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    What type of home do you live in: 1 story or 2 story? one    Caffeine 1/2 cup coffee a day      Objective:  Vital Signs:  There were no vitals taken for this visit.  ***  Labs and Imaging review: New results: 07/25/23: Lipid panel: tChol 113, LDL 49, TG 86.0 TSH wnl HbA1c: 6.8  EEG (02/22/23): Description: The patient is awake and asleep during the recording.  During maximal wakefulness, there is a symmetric, medium voltage 9 Hz posterior dominant rhythm that attenuates with eye opening.  There is continuous polymorphic theta and delta slowing over the right hemisphere, maximal over the right frontocentrotemporal regions, at times sharply contoured without clear epileptogenic potential. During drowsiness and sleep, there is an increase in theta slowing of the background. Vertex waves and symmetric sleep spindles were seen. Photic stimulation did not elicit any abnormalities.  There were no clear epileptiform discharges or electrographic seizures seen.     EKG lead was unremarkable.   Impression: This 1-hour awake and asleep EEG is abnormal due to focal slowing over the right hemisphere.   Clinical Correlation of the above findings indicates focal cerebral dysfunction over the right hemisphere consistent with prior stroke in this region. The absence of epileptiform discharges does not exclude a clinical diagnosis of epilepsy. Clinical correlation is advised.  CT head wo contrast (07/01/23): FINDINGS: Brain: Extensive right frontal lateral encephalomalacia is again noted with ex vacuo dilatation of the right lateral ventricle. Small old infarcts in  the bilateral cerebral hemispheres appear unchanged. There is mild periventricular white matter hypodensity, likely chronic small vessel ischemic change. There is no acute intracranial hemorrhage, extra-axial fluid collection, acute infarct or mass effect. There is no hydrocephalus.   Vascular: Atherosclerotic calcifications are present within the cavernous internal carotid arteries.   Skull: Normal. Negative for fracture or focal lesion.   Sinuses/Orbits: No acute finding.   Other: There is left parietal scalp soft tissue swelling and laceration.   IMPRESSION: 1. No acute intracranial process. 2. Left parietal scalp soft tissue  swelling and laceration. 3. Stable chronic changes.  CT cervical spine wo contrast (07/01/23): FINDINGS: Alignment: Mild reversal of the normal cervical lordosis is noted.   Skull base and vertebrae: 7 cervical segments are well visualized. Vertebral body height is well maintained. Mild osteophytic changes and facet hypertrophic changes are noted. No acute fracture or acute facet abnormality is noted.   Soft tissues and spinal canal: Surrounding soft tissue structures are within normal limits.   Upper chest: Visualized lung apices are within normal limits.   Other: None.   IMPRESSION: Multilevel degenerative changes without acute bony abnormality.  Previously reviewed results: Lab Results  Component Value Date    HGBA1C 6.3 (A) 01/02/2023    HGBA1C 6.3 01/02/2023    HGBA1C 6.3 01/02/2023    HGBA1C 6.3 01/02/2023      Recent Labs       Lab Results  Component Value Date    VITAMINB12 404 01/02/2023      Recent Labs[] Expand by Default       Lab Results  Component Value Date    TSH 1.15 01/02/2023      01/02/23: CBC w/ diff significant for thrombocytopenia (platelets 83 - chronic) CMP significant for glucose of 121 Folate wnl Lipid panel: Component     Latest Ref Rng 01/02/2023  Cholesterol     0 - 200 mg/dL 88    Triglycerides     0.0 - 149.0 mg/dL 829.5   HDL Cholesterol     >39.00 mg/dL 62.13 (L)   VLDL     0.0 - 40.0 mg/dL 08.6   LDL (calc)     0 - 99 mg/dL 30   Total CHOL/HDL Ratio 2   NonHDL 50.04       Imaging: CT head and cervical spine wo contrast (12/30/22): FINDINGS: CT HEAD FINDINGS   Brain: No acute hemorrhage. 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 Unchanged old perforator infarcts in the left caudate, right thalamus and bilateral cerebellar hemispheres. No hydrocephalus or extra-axial collection. No mass effect or midline shift.   Vascular: No hyperdense vessel or unexpected calcification.   Skull: No calvarial fracture or suspicious bone lesion. Skull base is unremarkable.   Sinuses/Orbits: No acute finding.   Other: None.   CT CERVICAL SPINE FINDINGS   Alignment: Unchanged 3 mm degenerative anterolisthesis of C4 on C5. No traumatic malalignment.   Skull base and vertebrae: No acute fracture. Normal craniocervical junction. No suspicious bone lesions.   Soft tissues and spinal canal: No prevertebral fluid or swelling. No visible canal hematoma.   Disc levels: Unchanged multilevel cervical spondylosis without high-grade spinal canal stenosis.   Upper chest: No acute findings.   Other: None.   IMPRESSION: 1. No acute intracranial abnormality. 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 2. No acute cervical spine fracture or traumatic malalignment.   CT head wo contrast (08/13/2018): IMPRESSION: 1. No acute intracranial abnormality detected. 2. Left posterior scalp swelling without evidence of a calvarial defect. 3. Large old right frontal lobe infarct. Probable old infarct involving the left cerebellum.  Assessment/Plan:  This is Heather Luna, a 73 y.o. female with: ***   Plan: ***  Return to clinic in ***  Total time spent reviewing records,  interview, history/exam, documentation, and coordination of care on day of encounter:  *** min  Rommie Coats, MD

## 2023-08-20 ENCOUNTER — Ambulatory Visit: Admitting: Physician Assistant

## 2023-08-20 ENCOUNTER — Other Ambulatory Visit: Payer: Self-pay

## 2023-08-20 DIAGNOSIS — M25532 Pain in left wrist: Secondary | ICD-10-CM

## 2023-08-20 NOTE — Progress Notes (Signed)
 Office Visit Note   Patient: Heather Luna           Date of Birth: 06/20/1950           MRN: [id number] Visit Date: 08/20/2023              Requested by: Mandy Second, PA [address] [place],  [place] [address] PCP: Mandy Second, [place]   Assessment & Plan: Visit Diagnoses:  1. Pain in left wrist     Plan: Impression is 12 weeks status post left distal radius and ulna fractures.  Patient is clinically and radiographically healed.  She may come out of her Velcro splint.  Advance with activity as tolerated.  Call with concerns or questions.  Follow-Up Instructions: Return if symptoms worsen or fail to improve.   Orders:  Orders Placed This Encounter  Procedures   XR Wrist Complete Left   No orders of the defined types were placed in this encounter.     Procedures: No procedures performed   Clinical Data: No additional findings.   Subjective: Chief Complaint  Patient presents with   Left Wrist - Follow-up    Wants to know if and when she can stop the wrist splint     HPI patient is a pleasant 73 year old female who comes in today approximately 12 weeks status post left distal radius and ulnar styloid fractures.  She has been doing well.  No complaints of pain.  She has been wearing her Velcro splint.      Objective: Vital Signs: There were no vitals taken for this visit.    Ortho Exam left wrist exam: No tenderness to the distal radius or ulna.  She is unable to actively move her wrist due to left-sided hemiparesis.  Specialty Comments:  No specialty comments available.  Imaging: No results found.   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) 01/02/2023   Hepatic cirrhosis (HCC) 01/02/2023   Recurrent falls 01/02/2023   History of seizure disorder 01/02/2023   Encephalomalacia with cerebral infarction (HCC) 01/02/2023   Recurrent UTI 09/24/2013   Absence of bladder continence 04/23/2012   Closed fracture of phalanx of foot 02/20/2012   Epilepsy (HCC) 07/03/2011   CVA (cerebral vascular accident) (HCC) 07/26/2005   Past Medical History:  Diagnosis Date   Anxiety    Arthritis    Cataract    Chronic UTI    Depression    Diabetes mellitus without complication (HCC)    Seizures (HCC)    Stroke (HCC)     Family History  Problem Relation Age of Onset   Rheum arthritis Mother    Cancer Mother    Heart failure Father    Heart attack Father    Breast cancer Paternal Grandmother     Past Surgical History:  Procedure Laterality Date   ABDOMINAL HYSTERECTOMY     APPENDECTOMY     BREAST SURGERY     left breast   CHOLECYSTECTOMY     EYE SURGERY     left cataract   FRACTURE SURGERY     R foot   MASTECTOMY Left 2007   Social History   Occupational History  Occupation: retired  Tobacco Use   Smoking status: Never   Smokeless tobacco: Never  Vaping Use   Vaping status: Never Used  Substance and Sexual Activity   Alcohol use: Never   Drug use: Never   Sexual activity: Never

## 2023-08-21 ENCOUNTER — Other Ambulatory Visit (HOSPITAL_BASED_OUTPATIENT_CLINIC_OR_DEPARTMENT_OTHER): Payer: Self-pay

## 2023-08-21 ENCOUNTER — Ambulatory Visit: Admitting: Neurology

## 2023-08-21 ENCOUNTER — Other Ambulatory Visit: Payer: Self-pay

## 2023-08-21 NOTE — Progress Notes (Deleted)
 NEUROLOGY FOLLOW UP OFFICE NOTE  NAAVYA Luna [id number]  Subjective:  Heather Luna is a 73 y.o. year old left-handed female with a medical history of stroke (1995 and 2007, right hemisphere) c/b seizure and left sided spasticity, DM, HLD, breast cancer, cirrhosis c/b thrombocytopenia who we last saw on 02/13/23 for stroke and seizure.  To briefly review: 02/13/23: Patient had a stroke in 1995. She has previously been seen by neurology at [facility] in 2019. 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 Her most recent MRI of the brain showed expected evolution of right ACA, right MCA, and bilateral (L>R) cerebellar/PICA infarcts. The most recent MRA head showed occlusion of the right A2 segment of the ACA, abnormalities of the right distal ACA. She also has a history of complex partial seizures for which she takes lamotrigine . She is referred for questions regarding her antiplatelet therapy. One of her strokes occurred while she was taking aspirin  and clopidogrel, and she has been maintained on aspirin -dipyridamole  ever since.   Medical co-morbidities noted to be contributing to the patient's stroke risk include the following: hyperlipidemia, prior strokes  Interval history:  Obtained history from: patient Patient caretaker: n/a (daughter helps out) [name] presents to [facility] today, accompanied by her daughter. During today's encounter, the the following history is added:   She endorses a history of what she describes as TIAs, most recently in 2012 with unusual behaviors (playing with dish towels, for instance), but none in the last 4-5 years. Regarding her antiplatelet medication, she was initially taking clopidogrel after her stroke in  1995, and then was taken off this medication for about 10 days or so prior to her surgery. After the second stroke she was started on Aggrenox , which she continues to take. However, the branded drug has become too expensive and the question is whether should she should switch to the generic brand.   The patient is interested in restarting PT. 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 She wears a brace due to foot drop on that side. She endorses pain at night in the left ankle and foot, as if someone were pulling on it.    At one point patient was evaluated in a [place]  ED and started on Lamictal  due to concern for seizures. Patient was found in the kitchen confused by her sister. She had EEG and per patient, they thought it was a "mini-stroke". Per patient Lamictal  was started in [place] when she returned. Patient has been on this for about 10 years. She is on 75 mg twice daily. There has been no concern for seizure since.    Daughter has some concerns about her mothers decision making and safety. She has spent money she does not have. She has significant weakness and spasticity of her left side. She has frequent falls, about 1 time per week. It usually happens when she is bending over and will lose her balance. She denies numbness, tingling, or pain.    She is currently on aggrenox  25-200 mg twice daily and atorvastatin  40 mg daily. Of note, on B complex.  Most recent Assessment and Plan (02/13/23): [name]  Heather Luna is a 73 y.o. female who presents for history of stroke c/b left sided weakness, numbness, and spasticity, possible history of seizures, and imbalance and falls. She has a relevant medical history of stroke (1995 and 2007, right hemisphere) c/b seizure and left sided spasticity, DM, HLD, breast cancer, cirrhosis c/b thrombocytopenia. 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 Available diagnostic data is significant for CT head showing extensive encephalomalacia in right ACA and MCA territories 2/2 prior infarct. Her left sided weakness, numbness, and spasticity are residuals from her prior stroke. We discussed treatment for spasticity including medication (such as baclofen) or botox. I am concerned a medication like baclofen may cause more generalized weakness and could contribute to her imbalance. Botox would likely be a better option. Patient would prefer to complete more PT and then decide if botox is needed.    In terms of her seizure history, this is unclear to me. Her previous neurologist mentions a history of partial seizures, but patient only recalls one episode of confusion in the past. She has been on Lamictal  for ~10 years without concern for additional episodes. We agreed to get EEG then discuss whether tapering Lamictal  makes sense.   PLAN: -1 hour EEG -Continue Lamictal  75 mg BID for now -Continue Aggrenox  -Continue atorvastatin  40 mg daily -Continue PT/OT for home health; will send a new referral if needed. Patient to discuss with PT -Discussed botox for spasticity, patient would like to continue try more PT first  Since their last visit: EEG showed focal slowing over right hemisphere. I called and messaged daughter as requested but did not get a response. I recommend that patient continue lamictal  due to EEG findings.  ***  MEDICATIONS:  Outpatient Encounter Medications as of 08/22/2023  Medication Sig   atorvastatin  (LIPITOR) 40 MG tablet Take 1 tablet (40 mg total) by mouth at bedtime.   b complex vitamins tablet Take 1 tablet by mouth daily.   citalopram  (CELEXA ) 20 MG tablet Take 1 tablet (20 mg total) by mouth daily.   CRANBERRY PO Take 1 tablet by mouth daily.   dipyridamole -aspirin  (AGGRENOX ) 200-25 MG 12hr capsule Take 1 capsule by mouth  2 (two) times daily.   Fluocinolone  Acetonide 0.01 % OIL Place 5 drops in ear(s) 2 (two) times daily as needed.   lactulose  (CHRONULAC ) 10 GM/15ML solution Take 15-30 ml up to twice daily for constipation and confusion from cirrhosis, aim for 2 bowel movements a day   lamoTRIgine  (LAMICTAL ) 25 MG tablet Take 3 tablets (75 mg total) by mouth 2 (two) times daily.   metFORMIN  (GLUCOPHAGE ) 500 MG tablet Take 1 tablet (500 mg total) by mouth 2 (two) times daily with a meal.   methenamine  (HIPREX ) 1 g tablet Take 1 tablet (1 g total) by mouth 2 (two) times daily with a meal.   nystatin  (MYCOSTATIN /NYSTOP ) powder Apply 1 Application topically 3 (three) times daily.   predniSONE  (DELTASONE ) 50 MG tablet Take 1 tablet (50 mg total) by mouth daily with breakfast.   tamsulosin  (FLOMAX ) 0.4 MG CAPS capsule Take 1 capsule (0.4 mg total) by mouth daily.   No facility-administered encounter medications on file as of 08/22/2023.    PAST MEDICAL HISTORY: Past Medical History:  Diagnosis Date   Anxiety    Arthritis    Cataract    Chronic UTI    Depression    Diabetes mellitus without complication (HCC)  Seizures (HCC)    Stroke (HCC)     PAST SURGICAL HISTORY: Past Surgical History:  Procedure Laterality Date   ABDOMINAL HYSTERECTOMY     APPENDECTOMY     BREAST SURGERY     left breast   CHOLECYSTECTOMY     EYE SURGERY     left cataract   FRACTURE SURGERY     R foot   MASTECTOMY Left 2007    ALLERGIES: Allergies  Allergen Reactions   Septra [Sulfamethoxazole-Trimethoprim] Hives   Ciprofloxacin  Rash   Levetiracetam Other (See Comments)    Crying and irritabliity    FAMILY HISTORY: Family History  Problem Relation Age of Onset   Rheum arthritis Mother    Cancer Mother    Heart failure Father    Heart attack Father    Breast cancer Paternal Grandmother     SOCIAL HISTORY: Social History   Tobacco Use   Smoking status: Never   Smokeless tobacco: Never  Vaping Use   Vaping  status: Never Used  Substance Use Topics   Alcohol use: Never   Drug use: Never   Social History   Social History Narrative   Are you right handed or left handed? Left handed but is now right handed   Are you currently employed ?    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    What type of home do you live in: 1 story or 2 story? one    Caffeine 1/2 cup coffee a day      Objective:  Vital Signs:  There were no vitals taken for this visit.  ***  Labs and Imaging review: New results: 07/25/23: Lipid panel: tChol 113, LDL 49, TG 86.0 TSH wnl HbA1c: 6.8  EEG (02/22/23): Description: The patient is awake and asleep during the recording.  During maximal wakefulness, there is a symmetric, medium voltage 9 Hz posterior dominant rhythm that attenuates with eye opening.  There is continuous polymorphic theta and delta slowing over the right hemisphere, maximal over the right frontocentrotemporal regions, at times sharply contoured without clear epileptogenic potential. During drowsiness and sleep, there is an increase in theta slowing of the background. Vertex waves and symmetric sleep spindles were seen. Photic stimulation did not elicit any abnormalities.  There were no clear epileptiform discharges or electrographic seizures seen.     EKG lead was unremarkable.   Impression: This 1-hour awake and asleep EEG is abnormal due to focal slowing over the right hemisphere.   Clinical Correlation of the above findings indicates focal cerebral dysfunction over the right hemisphere consistent with prior stroke in this region. The absence of epileptiform discharges does not exclude a clinical diagnosis of epilepsy. Clinical correlation is advised.  CT head wo contrast (07/01/23): FINDINGS: Brain: Extensive right frontal lateral encephalomalacia is again noted with ex vacuo dilatation of the right lateral ventricle. Small old infarcts in  the bilateral cerebral hemispheres appear unchanged. There is mild periventricular white matter hypodensity, likely chronic small vessel ischemic change. There is no acute intracranial hemorrhage, extra-axial fluid collection, acute infarct or mass effect. There is no hydrocephalus.   Vascular: Atherosclerotic calcifications are present within the cavernous internal carotid arteries.   Skull: Normal. Negative for fracture or focal lesion.   Sinuses/Orbits: No acute finding.   Other: There is left parietal scalp soft tissue swelling and laceration.   IMPRESSION: 1. No acute intracranial process. 2. Left parietal scalp soft tissue  swelling and laceration. 3. Stable chronic changes.  CT cervical spine wo contrast (07/01/23): FINDINGS: Alignment: Mild reversal of the normal cervical lordosis is noted.   Skull base and vertebrae: 7 cervical segments are well visualized. Vertebral body height is well maintained. Mild osteophytic changes and facet hypertrophic changes are noted. No acute fracture or acute facet abnormality is noted.   Soft tissues and spinal canal: Surrounding soft tissue structures are within normal limits.   Upper chest: Visualized lung apices are within normal limits.   Other: None.   IMPRESSION: Multilevel degenerative changes without acute bony abnormality.  Previously reviewed results: Lab Results  Component Value Date    HGBA1C 6.3 (A) 01/02/2023    HGBA1C 6.3 01/02/2023    HGBA1C 6.3 01/02/2023    HGBA1C 6.3 01/02/2023      Recent Labs       Lab Results  Component Value Date    VITAMINB12 404 01/02/2023      Recent Labs[] Expand by Default       Lab Results  Component Value Date    TSH 1.15 01/02/2023      01/02/23: CBC w/ diff significant for thrombocytopenia (platelets 83 - chronic) CMP significant for glucose of 121 Folate wnl Lipid panel: Component     Latest Ref Rng 01/02/2023  Cholesterol     0 - 200 mg/dL 88    Triglycerides     0.0 - 149.0 mg/dL 409.8   HDL Cholesterol     >39.00 mg/dL 11.91 (L)   VLDL     0.0 - 40.0 mg/dL 47.8   LDL (calc)     0 - 99 mg/dL 30   Total CHOL/HDL Ratio 2   NonHDL 50.04       Imaging: CT head and cervical spine wo contrast (12/30/22): FINDINGS: CT HEAD FINDINGS   Brain: No acute hemorrhage. 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 Unchanged old perforator infarcts in the left caudate, right thalamus and bilateral cerebellar hemispheres. No hydrocephalus or extra-axial collection. No mass effect or midline shift.   Vascular: No hyperdense vessel or unexpected calcification.   Skull: No calvarial fracture or suspicious bone lesion. Skull base is unremarkable.   Sinuses/Orbits: No acute finding.   Other: None.   CT CERVICAL SPINE FINDINGS   Alignment: Unchanged 3 mm degenerative anterolisthesis of C4 on C5. No traumatic malalignment.   Skull base and vertebrae: No acute fracture. Normal craniocervical junction. No suspicious bone lesions.   Soft tissues and spinal canal: No prevertebral fluid or swelling. No visible canal hematoma.   Disc levels: Unchanged multilevel cervical spondylosis without high-grade spinal canal stenosis.   Upper chest: No acute findings.   Other: None.   IMPRESSION: 1. No acute intracranial abnormality. 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 2. No acute cervical spine fracture or traumatic malalignment.   CT head wo contrast (08/13/2018): IMPRESSION: 1. No acute intracranial abnormality detected. 2. Left posterior scalp swelling without evidence of a calvarial defect. 3. Large old right frontal lobe infarct. Probable old infarct involving the left cerebellum.  Assessment/Plan:  This is SHAKETHA JEON, a 73 y.o. female with: ***   Plan: ***  Return to clinic in ***  Total time spent reviewing records,  interview, history/exam, documentation, and coordination of care on day of encounter:  *** min  Rommie Coats, MD

## 2023-08-22 ENCOUNTER — Ambulatory Visit: Admitting: Neurology

## 2023-08-22 ENCOUNTER — Other Ambulatory Visit (HOSPITAL_BASED_OUTPATIENT_CLINIC_OR_DEPARTMENT_OTHER): Payer: Self-pay

## 2023-08-24 ENCOUNTER — Emergency Department (HOSPITAL_COMMUNITY)

## 2023-08-24 ENCOUNTER — Other Ambulatory Visit: Payer: Self-pay

## 2023-08-24 ENCOUNTER — Encounter (HOSPITAL_COMMUNITY): Payer: Self-pay

## 2023-08-24 ENCOUNTER — Emergency Department (HOSPITAL_COMMUNITY)
Admission: EM | Admit: 2023-08-24 | Discharge: 2023-08-24 | Disposition: A | Discharge status: Home / Self Care | Attending: Emergency Medicine | Admitting: Emergency Medicine

## 2023-08-24 DIAGNOSIS — S0101XA Laceration without foreign body of scalp, initial encounter: Secondary | ICD-10-CM | POA: Insufficient documentation

## 2023-08-24 DIAGNOSIS — W19XXXA Unspecified fall, initial encounter: Secondary | ICD-10-CM

## 2023-08-24 DIAGNOSIS — Z7982 Long term (current) use of aspirin: Secondary | ICD-10-CM | POA: Insufficient documentation

## 2023-08-24 DIAGNOSIS — Z7984 Long term (current) use of oral hypoglycemic drugs: Secondary | ICD-10-CM | POA: Diagnosis not present

## 2023-08-24 DIAGNOSIS — R079 Chest pain, unspecified: Secondary | ICD-10-CM | POA: Diagnosis not present

## 2023-08-24 DIAGNOSIS — W010XXA Fall on same level from slipping, tripping and stumbling without subsequent striking against object, initial encounter: Secondary | ICD-10-CM | POA: Diagnosis not present

## 2023-08-24 DIAGNOSIS — N189 Chronic kidney disease, unspecified: Secondary | ICD-10-CM | POA: Diagnosis not present

## 2023-08-24 DIAGNOSIS — S0990XA Unspecified injury of head, initial encounter: Secondary | ICD-10-CM | POA: Diagnosis present

## 2023-08-24 DIAGNOSIS — Z8673 Personal history of transient ischemic attack (TIA), and cerebral infarction without residual deficits: Secondary | ICD-10-CM | POA: Insufficient documentation

## 2023-08-24 DIAGNOSIS — Z79899 Other long term (current) drug therapy: Secondary | ICD-10-CM | POA: Diagnosis not present

## 2023-08-24 DIAGNOSIS — E1122 Type 2 diabetes mellitus with diabetic chronic kidney disease: Secondary | ICD-10-CM | POA: Insufficient documentation

## 2023-08-24 DIAGNOSIS — S301XXA Contusion of abdominal wall, initial encounter: Secondary | ICD-10-CM | POA: Insufficient documentation

## 2023-08-24 DIAGNOSIS — H1131 Conjunctival hemorrhage, right eye: Secondary | ICD-10-CM | POA: Insufficient documentation

## 2023-08-24 LAB — LIPASE, BLOOD: Lipase: 76 U/L — ABNORMAL HIGH (ref 11–51)

## 2023-08-24 LAB — CBC
HCT: 29.7 % — ABNORMAL LOW (ref 36.0–46.0)
Hemoglobin: 9.4 g/dL — ABNORMAL LOW (ref 12.0–15.0)
MCH: 29.6 pg (ref 26.0–34.0)
MCHC: 31.6 g/dL (ref 30.0–36.0)
MCV: 93.4 fL (ref 80.0–100.0)
Platelets: 64 10*3/uL — ABNORMAL LOW (ref 150–400)
RBC: 3.18 MIL/uL — ABNORMAL LOW (ref 3.87–5.11)
RDW: 16.2 % — ABNORMAL HIGH (ref 11.5–15.5)
WBC: 4.2 10*3/uL (ref 4.0–10.5)
nRBC: 0 % (ref 0.0–0.2)

## 2023-08-24 LAB — URINALYSIS, ROUTINE W REFLEX MICROSCOPIC
Bilirubin Urine: NEGATIVE
Glucose, UA: NEGATIVE mg/dL
Hgb urine dipstick: NEGATIVE
Ketones, ur: NEGATIVE mg/dL
Leukocytes,Ua: NEGATIVE
Nitrite: NEGATIVE
Protein, ur: NEGATIVE mg/dL
Specific Gravity, Urine: 1.025 (ref 1.005–1.030)
pH: 7 (ref 5.0–8.0)

## 2023-08-24 LAB — COMPREHENSIVE METABOLIC PANEL WITH GFR
ALT: 19 U/L (ref 0–44)
AST: 30 U/L (ref 15–41)
Albumin: 3.7 g/dL (ref 3.5–5.0)
Alkaline Phosphatase: 69 U/L (ref 38–126)
Anion gap: 8 (ref 5–15)
BUN: 16 mg/dL (ref 8–23)
CO2: 25 mmol/L (ref 22–32)
Calcium: 9.1 mg/dL (ref 8.9–10.3)
Chloride: 108 mmol/L (ref 98–111)
Creatinine, Ser: 0.89 mg/dL (ref 0.44–1.00)
GFR, Estimated: >60 mL/min (ref >60)
Glucose, Bld: 143 mg/dL — ABNORMAL HIGH (ref 70–99)
Potassium: 4.5 mmol/L (ref 3.5–5.1)
Sodium: 141 mmol/L (ref 135–145)
Total Bilirubin: 0.8 mg/dL (ref 0.0–1.2)
Total Protein: 6.2 g/dL — ABNORMAL LOW (ref 6.5–8.1)

## 2023-08-24 LAB — PROTIME-INR
INR: 1 (ref 0.8–1.2)
Prothrombin Time: 13.3 s (ref 11.4–15.2)

## 2023-08-24 LAB — I-STAT CHEM 8, ED
BUN: 22 mg/dL (ref 8–23)
Calcium, Ion: 1.13 mmol/L — ABNORMAL LOW (ref 1.15–1.40)
Chloride: 106 mmol/L (ref 98–111)
Creatinine, Ser: 0.8 mg/dL (ref 0.44–1.00)
Glucose, Bld: 137 mg/dL — ABNORMAL HIGH (ref 70–99)
HCT: 32 % — ABNORMAL LOW (ref 36.0–46.0)
Hemoglobin: 10.9 g/dL — ABNORMAL LOW (ref 12.0–15.0)
Potassium: 5 mmol/L (ref 3.5–5.1)
Sodium: 139 mmol/L (ref 135–145)
TCO2: 25 mmol/L (ref 22–32)

## 2023-08-24 LAB — SAMPLE TO BLOOD BANK

## 2023-08-24 LAB — I-STAT CG4 LACTIC ACID, ED: Lactic Acid, Venous: 1.4 mmol/L (ref 0.5–1.9)

## 2023-08-24 LAB — ETHANOL: Alcohol, Ethyl (B): <15 mg/dL (ref <15)

## 2023-08-24 MED ORDER — LIDOCAINE-EPINEPHRINE-TETRACAINE (LET) TOPICAL GEL: 3.0000 mL | Freq: Once | TOPICAL | Status: DC

## 2023-08-24 MED ORDER — IOHEXOL 350 MG/ML SOLN
75.0000 mL | Freq: Once | INTRAVENOUS | Status: AC | PRN
  Administered 2023-08-24: 75 mL via INTRAVENOUS

## 2023-08-24 MED ORDER — LIDOCAINE-EPINEPHRINE (PF) 2 %-1:200000 IJ SOLN
INTRAMUSCULAR | Status: AC
  Administered 2023-08-24: 10 mL via INTRADERMAL
  Filled 2023-08-24: qty 20

## 2023-08-24 MED ORDER — LIDOCAINE-EPINEPHRINE-TETRACAINE (LET) TOPICAL GEL
3.0000 mL | Freq: Once | TOPICAL | Status: AC
  Administered 2023-08-24: 3 mL via TOPICAL
  Filled 2023-08-24: qty 3

## 2023-08-24 MED ORDER — LIDOCAINE-EPINEPHRINE (PF) 2 %-1:200000 IJ SOLN: 10.0000 mL | Freq: Once | INTRAMUSCULAR | Status: AC

## 2023-08-24 NOTE — Progress Notes (Signed)
   08/24/23 1009  Spiritual Encounters  Type of Visit Initial  Care provided to: Pt not available  Conversation partners present during Programmer, systems;Other (comment) (EMS)  Referral source Trauma page  Reason for visit Trauma  OnCall Visit Yes   Chaplain responded to a trauma in the ED - level II, fall on blood thinners. Per EMS, pt's family is on the way. Per RN, no needs at this time. Spiritual care services available as needed.   Marion Sicilian, Chaplain 08/24/23

## 2023-08-24 NOTE — ED Triage Notes (Signed)
 Pt bib ems from home. Pt had mechanical fall this morning, 2in lac to back of head, no LOC. No neck or back pain pt a.o

## 2023-08-24 NOTE — ED Notes (Signed)
Discharge instructions reviewed with patient. Patient questions answered and opportunity for education reviewed. Patient voices understanding of discharge instructions with no further questions. Patient to lobby via wheelchair. 

## 2023-08-24 NOTE — Discharge Instructions (Addendum)
 Your history, exam, and workup today led us  to do all the imaging to look for traumatic injuries.  The only finding was the soft tissue hematoma on your scalp with the laceration.  Your laceration was repaired with nonabsorbable suture so you will need them taken out in a week to 10 days.  It was covered in an antibiotic gel and your tetanus shot was up-to-date.  Given the other imaging being reassuring we feel you are safe for discharge home.  Please follow-up with your primary doctor for reassessment and please rest and stay hydrated.  If any symptoms change or worsen acutely, please turn to the nearest emergency department.

## 2023-08-24 NOTE — Progress Notes (Signed)
 Orthopedic Tech Progress Note Patient Details:  Heather Luna [birth year] [id number]  Level II trauma, ortho tech services not needed at this time.  Patient ID: Heather Luna, female   DOB: 14-Dec-1950, 73 y.o.   MRN: [id number]  [name] 08/24/2023, 11:50 AM

## 2023-08-24 NOTE — ED Notes (Signed)
 Trauma Response Nurse Documentation  Heather Luna is a 73 y.o. female arriving to [facility] ED via EMS  On dipyridamole  SR 250 mg/aspirin  25 mg twice a day. Trauma was activated as a Level 2 based on the following trauma criteria Elderly patients > 65 with head trauma on anti-coagulation (excluding ASA).  Patient cleared for CT by Dr. Manus Sellers. Pt transported to CT with trauma response nurse present to monitor. RN remained with the patient throughout their absence from the department for clinical observation. GCS 15.  History   Past Medical History:  Diagnosis Date   Anxiety    Arthritis    Cataract    Chronic UTI    Depression    Diabetes mellitus without complication (HCC)    Seizures (HCC)    Stroke (HCC)      Past Surgical History:  Procedure Laterality Date   ABDOMINAL HYSTERECTOMY     APPENDECTOMY     BREAST SURGERY     left breast   CHOLECYSTECTOMY     EYE SURGERY     left cataract   FRACTURE SURGERY     R foot   MASTECTOMY Left 2007     Initial Focused Assessment (If applicable, or please see trauma documentation): Patient A&Ox4, GCS 15, PERR 3 Airway intact, bilateral breath sounds Pulses 2+ Laceration to back of head Previous stroke with full L paralysis  CT's Completed:   CT Head, CT C-Spine, CT Chest w/ contrast, and CT abdomen/pelvis w/ contrast   Interventions:  IV, labs CXR/PXR CT Head/Cspine/C/A/P Laceration repair  Plan for disposition:  Discharge home   Event Summary: Patient to ED after fall at home. Patient was able to push her life alert button. Patient has residual L paralysis from previous stroke. Per patient Tdap recently updated. Imaging was ordered and was negative for injury. Laceration to back of head repaired by Dr Manus Sellers. Patients daughter at bedside. Patient able to discharge home with daughter.  Bedside handoff with ED RN Carolynne Citron.    Heather Luna  Trauma Response RN  Please call TRN at [phone] for further  assistance.

## 2023-08-24 NOTE — ED Provider Notes (Signed)
 [place] EMERGENCY DEPARTMENT AT [facility] Provider Note   CSN: [id number] Arrival date & time: 08/24/23  7829     History  Chief Complaint  Patient presents with   Heather Luna    Heather Luna is a 73 y.o. female.  The history is provided by the patient, medical records and the EMS personnel. No language interpreter was used.  Fall This is a new problem. The current episode started less than 1 hour ago. The problem has not changed since onset.Associated symptoms include chest pain, abdominal pain and headaches. Pertinent negatives include no shortness of breath. Nothing aggravates the symptoms. Nothing relieves the symptoms. She has tried nothing for the symptoms. The treatment provided no relief.       Home Medications Prior to Admission medications   Medication Sig Start Date End Date Taking? Authorizing Provider  atorvastatin  (LIPITOR) 40 MG tablet Take 1 tablet (40 mg total) by mouth at bedtime. 06/25/23   Crain, Whitney L, PA  b complex vitamins tablet Take 1 tablet by mouth daily. 07/05/11   [provider]  citalopram  (CELEXA ) 20 MG tablet Take 1 tablet (20 mg total) by mouth daily. 05/07/23   Crain, Whitney L, PA  CRANBERRY PO Take 1 tablet by mouth daily.    [provider]  dipyridamole -aspirin  (AGGRENOX ) 200-25 MG 12hr capsule Take 1 capsule by mouth 2 (two) times daily. 05/20/23   Crain, Whitney L, PA  Fluocinolone  Acetonide 0.01 % OIL Place 5 drops in ear(s) 2 (two) times daily as needed. 07/25/23   Crain, Laurina Popper L, PA  lactulose  (CHRONULAC ) 10 GM/15ML solution Take 15-30 ml up to twice daily for constipation and confusion from cirrhosis, aim for 2 bowel movements a day 03/22/23   Edmonia Gottron, PA-C  lamoTRIgine  (LAMICTAL ) 25 MG tablet Take 3 tablets (75 mg total) by mouth 2 (two) times daily. 07/17/23   Crain, Laurina Popper L, PA  metFORMIN  (GLUCOPHAGE ) 500 MG tablet Take 1 tablet (500 mg total) by mouth 2 (two) times daily with a meal. 06/25/23    Crain, Whitney L, PA  methenamine  (HIPREX ) 1 g tablet Take 1 tablet (1 g total) by mouth 2 (two) times daily with a meal. 08/05/23   Crain, Whitney L, PA  nystatin  (MYCOSTATIN /NYSTOP ) powder Apply 1 Application topically 3 (three) times daily. 04/25/23   Crain, Whitney L, PA  predniSONE  (DELTASONE ) 50 MG tablet Take 1 tablet (50 mg total) by mouth daily with breakfast. 08/02/23   Crain, Whitney L, PA  tamsulosin  (FLOMAX ) 0.4 MG CAPS capsule Take 1 capsule (0.4 mg total) by mouth daily. 08/05/23   Crain, Laurina Popper L, PA      Allergies    Septra [sulfamethoxazole-trimethoprim], Ciprofloxacin , and Levetiracetam    Review of Systems   Review of Systems  Constitutional:  Negative for chills, fatigue and fever.  HENT:  Negative for congestion.   Respiratory:  Negative for cough, chest tightness, shortness of breath and wheezing.   Cardiovascular:  Positive for chest pain. Negative for palpitations and leg swelling.  Gastrointestinal:  Positive for abdominal pain. Negative for constipation, diarrhea, nausea and vomiting.  Musculoskeletal:  Negative for back pain, neck pain and neck stiffness.  Skin:  Positive for color change (bruisnig) and wound.  Neurological:  Positive for headaches. Negative for weakness, light-headedness and numbness.  Psychiatric/Behavioral:  Negative for agitation and confusion.   All other systems reviewed and are negative.   Physical Exam Updated Vital Signs BP 122/60   Pulse 76  Temp (!) 97.5 F (36.4 C) (Temporal)   Resp 18   Ht 5\' 2"  (1.575 m)   Wt 66.2 kg   SpO2 96%   BMI 26.69 kg/m  Physical Exam Vitals and nursing note reviewed.  Constitutional:      General: She is not in acute distress.    Appearance: She is well-developed. She is not ill-appearing, toxic-appearing or diaphoretic.  HENT:     Head: Abrasion, contusion and laceration present. No raccoon eyes or Battle's sign.      Nose: No congestion or rhinorrhea.     Mouth/Throat:     Mouth: Mucous  membranes are moist.     Pharynx: No oropharyngeal exudate or posterior oropharyngeal erythema.  Eyes:     General: No scleral icterus.    Extraocular Movements: Extraocular movements intact.     Right eye: Normal extraocular motion and no nystagmus.     Left eye: Normal extraocular motion and no nystagmus.     Conjunctiva/sclera:     Right eye: Hemorrhage present.      Comments: Subconjunctival hemorrhage on right lateral eye.  Pupil symmetric and reactive with normal extraocular movements.  Cardiovascular:     Rate and Rhythm: Normal rate and regular rhythm.     Heart sounds: No murmur heard. Pulmonary:     Effort: Pulmonary effort is normal. No respiratory distress.     Breath sounds: Normal breath sounds.  Abdominal:     Palpations: Abdomen is soft.     Tenderness: There is no abdominal tenderness. There is no right CVA tenderness, left CVA tenderness, guarding or rebound.  Musculoskeletal:        General: No swelling.     Cervical back: Neck supple.  Skin:    General: Skin is warm and dry.     Capillary Refill: Capillary refill takes less than 2 seconds.  Neurological:     Mental Status: She is alert.  Psychiatric:        Mood and Affect: Mood normal.     ED Results / Procedures / Treatments   Labs (all labs ordered are listed, but only abnormal results are displayed) Labs Reviewed  COMPREHENSIVE METABOLIC PANEL WITH GFR - Abnormal; Notable for the following components:      Result Value   Glucose, Bld 143 (*)    Total Protein 6.2 (*)    All other components within normal limits  URINALYSIS, ROUTINE W REFLEX MICROSCOPIC - Abnormal; Notable for the following components:   APPearance HAZY (*)    All other components within normal limits  LIPASE, BLOOD - Abnormal; Notable for the following components:   Lipase 76 (*)    All other components within normal limits  CBC - Abnormal; Notable for the following components:   RBC 3.18 (*)    Hemoglobin 9.4 (*)    HCT 29.7  (*)    RDW 16.2 (*)    Platelets 64 (*)    All other components within normal limits  I-STAT CHEM 8, ED - Abnormal; Notable for the following components:   Glucose, Bld 137 (*)    Calcium , Ion 1.13 (*)    Hemoglobin 10.9 (*)    HCT 32.0 (*)    All other components within normal limits  ETHANOL  PROTIME-INR  I-STAT CG4 LACTIC ACID, ED  SAMPLE TO BLOOD BANK    EKG None  Radiology CT CHEST ABDOMEN PELVIS W CONTRAST Result Date: 08/24/2023 CLINICAL DATA:  Polytrauma, blunt EXAM: CT CHEST, ABDOMEN, AND PELVIS  WITH CONTRAST TECHNIQUE: Multidetector CT imaging of the chest, abdomen and pelvis was performed following the standard protocol during bolus administration of intravenous contrast. RADIATION DOSE REDUCTION: This exam was performed according to the departmental dose-optimization program which includes automated exposure control, adjustment of the mA and/or kV according to patient size and/or use of iterative reconstruction technique. CONTRAST:  75mL OMNIPAQUE  IOHEXOL  350 MG/ML SOLN COMPARISON:  CT abdomen pelvis 08/19/2021 FINDINGS: CHEST: Cardiovascular: No aortic injury. The thoracic aorta is normal in caliber. The heart is normal in size. No significant pericardial effusion. At least 3 vessel coronary calcification. Atherosclerotic plaque. Mediastinum/Nodes: No pneumomediastinum. No mediastinal hematoma. The esophagus is unremarkable.  Trace hiatal hernia. The thyroid  is unremarkable. The central airways are patent. No mediastinal, hilar, or axillary lymphadenopathy. Lungs/Pleura: Left lower lobe atelectasis. No focal consolidation. No pulmonary nodule. No pulmonary mass. No pulmonary contusion or laceration. No pneumatocele formation. Trace left pleural effusion. No pneumothorax. No hemothorax. Musculoskeletal/Chest wall: No chest wall mass.  Left mastectomy. No acute rib or sternal fracture. No spinal fracture. ABDOMEN / PELVIS: Hepatobiliary: Enlarged 19 cm liver. Nodular hepatic contour.  No focal lesion. No laceration or subcapsular hematoma. The gallbladder is otherwise unremarkable with no radio-opaque gallstones. No biliary ductal dilatation. Pancreas: Normal pancreatic contour. No main pancreatic duct dilatation. Spleen: Enlarged spleen. Slightly heterogeneous appearance of the posterior spleen with limited evaluation due to streak artifact originating from the left upper extremity along patient's trunk. Question resolution of findings on delayed imaging with limited evaluation due to artifact. Specific subcentimeter hypodensity likely a cyst. No definite laceration, subcapsular hematoma, or vascular injury. Adrenals/Urinary Tract: No nodularity bilaterally. Bilateral kidneys enhance symmetrically. No hydronephrosis. No contusion, laceration, or subcapsular hematoma. No injury to the vascular structures or collecting systems. No hydroureter. The urinary bladder is unremarkable. On delayed imaging, there is no urothelial wall thickening and there are no filling defects in the opacified portions of the bilateral collecting systems or ureters. Stomach/Bowel: No small or large bowel wall thickening or dilatation. Colonic diverticulosis. The appendix is unremarkable. Vasculature/Lymphatics: The main portal, splenic, superior mesenteric veins are patent. Multiple venous collaterals are noted. Moderate atherosclerotic plaque. No abdominal aorta or iliac aneurysm. No active contrast extravasation or pseudoaneurysm. No abdominal, pelvic, inguinal lymphadenopathy. Reproductive: The struck knee.  No adnexal mass. Other: No simple free fluid ascites. No pneumoperitoneum. No hemoperitoneum. No mesenteric hematoma identified. No organized fluid collection. Musculoskeletal: Bilateral flank subcutaneus soft tissue edema with developing left flank hematoma. No acute pelvic fracture. No spinal fracture. Grade 1 anterolisthesis of L4 on L5. Other ports and devices: None. IMPRESSION: 1. No acute intrathoracic  injury. 2. Slightly heterogeneous appearance of the posterior spleen with question resolution of findings on delayed imaging which would suggest a variant perfusion and no specific finding. Markedly limited evaluation due to streak artifact originating from the left upper extremity along patient's trunk. Finding could represent underlying splenic injury (given overlying developing trace left flank subcutaneus soft tissue hematoma) versus less likely mass. No associated hemoperitoneum or vascular injury. Correlate with clinical findings and abdominal exam. 3. Cirrhosis with portal hypertension No focal liver lesions identified. Please note that liver protocol enhanced MR and CT are the most sensitive tests for the screening detection of hepatocellular carcinoma in the high risk setting of cirrhosis. 4. Colonic diverticulosis with no acute diverticulitis. 5.  Aortic Atherosclerosis (ICD10-I70.0). Electronically Signed   By: Morgane  Naveau M.D.   On: 08/24/2023 11:46   CT HEAD WO CONTRAST Result Date: 08/24/2023 CLINICAL DATA:  Head trauma, moderate-severe; Polytrauma, blunt EXAM: CT HEAD WITHOUT CONTRAST CT CERVICAL SPINE WITHOUT CONTRAST TECHNIQUE: Multidetector CT imaging of the head and cervical spine was performed following the standard protocol without intravenous contrast. Multiplanar CT image reconstructions of the cervical spine were also generated. RADIATION DOSE REDUCTION: This exam was performed according to the departmental dose-optimization program which includes automated exposure control, adjustment of the mA and/or kV according to patient size and/or use of iterative reconstruction technique. COMPARISON:  CT head 07/01/2023 FINDINGS: CT HEAD FINDINGS Brain: Chronic right ACA territory infarction. No evidence of large-territorial acute infarction. No parenchymal hemorrhage. No mass lesion. No extra-axial collection. No mass effect or midline shift. No hydrocephalus. Basilar cisterns are patent.  Vascular: No hyperdense vessel. Atherosclerotic calcifications are present within the cavernous internal carotid arteries. Skull: No acute fracture or focal lesion. Sinuses/Orbits: Paranasal sinuses and mastoid air cells are clear. Bilateral lens replacement. Otherwise the orbits are unremarkable. Other: Left parieto-occipital scalp hematoma formation measuring up to 1.9 cm. Overlying dermal soft tissue defect. No retained radiopaque foreign body. CT CERVICAL SPINE FINDINGS Alignment: Grade 1 anterolisthesis of C4 on C5. Skull base and vertebrae: Multilevel mild-to-moderate degenerative changes spine most prominent at the C5-C6 level. No severe osseous neural foraminal or central canal stenosis. No acute fracture. No aggressive appearing focal osseous lesion or focal pathologic process. Soft tissues and spinal canal: No prevertebral fluid or swelling. No visible canal hematoma. Upper chest: Unremarkable. Other: Atherosclerotic plaque of the aorta and its main branches. IMPRESSION: 1. No acute intracranial abnormality. 2. No acute displaced fracture or traumatic listhesis of the cervical spine. 3. A 1.9 cm left parieto-occipital scalp hematoma. No retained radiopaque foreign body. Electronically Signed   By: Morgane  Naveau M.D.   On: 08/24/2023 11:22   CT CERVICAL SPINE WO CONTRAST Result Date: 08/24/2023 CLINICAL DATA:  Head trauma, moderate-severe; Polytrauma, blunt EXAM: CT HEAD WITHOUT CONTRAST CT CERVICAL SPINE WITHOUT CONTRAST TECHNIQUE: Multidetector CT imaging of the head and cervical spine was performed following the standard protocol without intravenous contrast. Multiplanar CT image reconstructions of the cervical spine were also generated. RADIATION DOSE REDUCTION: This exam was performed according to the departmental dose-optimization program which includes automated exposure control, adjustment of the mA and/or kV according to patient size and/or use of iterative reconstruction technique. COMPARISON:   CT head 07/01/2023 FINDINGS: CT HEAD FINDINGS Brain: Chronic right ACA territory infarction. No evidence of large-territorial acute infarction. No parenchymal hemorrhage. No mass lesion. No extra-axial collection. No mass effect or midline shift. No hydrocephalus. Basilar cisterns are patent. Vascular: No hyperdense vessel. Atherosclerotic calcifications are present within the cavernous internal carotid arteries. Skull: No acute fracture or focal lesion. Sinuses/Orbits: Paranasal sinuses and mastoid air cells are clear. Bilateral lens replacement. Otherwise the orbits are unremarkable. Other: Left parieto-occipital scalp hematoma formation measuring up to 1.9 cm. Overlying dermal soft tissue defect. No retained radiopaque foreign body. CT CERVICAL SPINE FINDINGS Alignment: Grade 1 anterolisthesis of C4 on C5. Skull base and vertebrae: Multilevel mild-to-moderate degenerative changes spine most prominent at the C5-C6 level. No severe osseous neural foraminal or central canal stenosis. No acute fracture. No aggressive appearing focal osseous lesion or focal pathologic process. Soft tissues and spinal canal: No prevertebral fluid or swelling. No visible canal hematoma. Upper chest: Unremarkable. Other: Atherosclerotic plaque of the aorta and its main branches. IMPRESSION: 1. No acute intracranial abnormality. 2. No acute displaced fracture or traumatic listhesis of the cervical spine. 3. A 1.9 cm left parieto-occipital scalp hematoma. No retained  radiopaque foreign body. Electronically Signed   By: Morgane  Naveau M.D.   On: 08/24/2023 11:22   DG Pelvis Portable Result Date: 08/24/2023 CLINICAL DATA:  Trauma.  Fall. EXAM: PORTABLE PELVIS 1-2 VIEWS COMPARISON:  None Available. FINDINGS: Pelvis is intact with normal and symmetric sacroiliac joints. No acute fracture or dislocation. No aggressive osseous lesion. Visualized sacral arcuate lines are unremarkable. Unremarkable symphysis pubis. There are mild degenerative  changes of bilateral hip joints without significant joint space narrowing. Osteophytosis of the superior acetabulum. No radiopaque foreign bodies. IMPRESSION: No acute osseous abnormality of the pelvis. Electronically Signed   By: Beula Brunswick M.D.   On: 08/24/2023 10:11   DG Chest Port 1 View Result Date: 08/24/2023 CLINICAL DATA:  Trauma.  Fall. EXAM: PORTABLE CHEST 1 VIEW COMPARISON:  08/19/2021. FINDINGS: Low lung volume. Bilateral lung fields are clear. Bilateral costophrenic angles are clear. Stable cardio-mediastinal silhouette. No acute osseous abnormalities. The soft tissues are within normal limits. A battery pack is seen overlying the right lung apex. There are surgical staples overlying the left lower lateral chest. IMPRESSION: No active disease. Electronically Signed   By: Beula Brunswick M.D.   On: 08/24/2023 10:10    Procedures .Laceration Repair  Date/Time: 08/24/2023 3:51 PM  Performed by: Craige Dixon, MD Authorized by: Craige Dixon, MD   Consent:    Consent obtained:  Verbal and written   Consent given by:  Patient   Risks, benefits, and alternatives were discussed: yes     Risks discussed:  Pain, infection and poor cosmetic result   Alternatives discussed:  No treatment Universal protocol:    Immediately prior to procedure, a time out was called: yes     Patient identity confirmed:  Verbally with patient Anesthesia:    Anesthesia method:  Topical application and local infiltration   Topical anesthetic:  LET   Local anesthetic:  Lidocaine 2% WITH epi Laceration details:    Location:  Scalp   Scalp location:  L parietal   Length (cm):  3   Depth (mm):  2 Pre-procedure details:    Preparation:  Patient was prepped and draped in usual sterile fashion and imaging obtained to evaluate for foreign bodies Exploration:    Limited defect created (wound extended): no     Imaging outcome: foreign body not noted     Wound exploration: wound explored  through full range of motion and entire depth of wound visualized   Treatment:    Area cleansed with:  Chlorhexidine, Shur-Clens and saline   Amount of cleaning:  Standard Skin repair:    Repair method:  Sutures   Suture size:  4-0   Suture material:  Prolene   Suture technique:  Simple interrupted   Number of sutures:  6 Approximation:    Approximation:  Close Repair type:    Repair type:  Simple Post-procedure details:    Dressing:  Antibiotic ointment   Procedure completion:  Tolerated     Medications Ordered in ED Medications  iohexol  (OMNIPAQUE ) 350 MG/ML injection 75 mL (75 mLs Intravenous Contrast Given 08/24/23 1047)  lidocaine-EPINEPHrine-tetracaine (LET) topical gel (3 mLs Topical Given 08/24/23 1307)  lidocaine-EPINEPHrine (XYLOCAINE W/EPI) 2 %-1:200000 (PF) injection 10 mL (10 mLs Intradermal Given 08/24/23 1519)    ED Course/ Medical Decision Making/ A&P                                 Medical  Decision Making Amount and/or Complexity of Data Reviewed Labs: ordered. Radiology: ordered.  Risk Prescription drug management.    Heather Luna is a 73 y.o. female with a past medical history significant for previous stroke, epilepsy, diabetes, cirrhosis, and CKD who presents with mechanical fall as a level 2 trauma.  According to patient, she had a fall while going into the threshold of her bathroom and she tripped and fell the ground.  She did hit her left side of her head on the hard tile.  She did not Luna consciousness.  She has some mild headache but not significant.  EMS reported no loss of conscious.  Patient's vital signs reportedly reassuring and route.  She also has a bruising on her left torso but is denying significant anterior chest or abdominal pain.  She does have left-sided weakness that is unchanged for baseline she reports with her previous trip.  On exam, lungs were clear.  Chest was tender on the left lateral chest and tender on her left lateral  abdomen with some bruising and abrasion.  She has a wound on her left skull that is wrapped up with gauze.  Will continue workup and then assess for wound management.  Patient reports her tetanus shot is up-to-date.  Otherwise patient reports no preceding symptoms such as fevers, chills, congestion, cough, nausea, vomiting, conservation, diarrhea, or urinary change.  On arrival, airway was intact and breath sounds equal bilaterally.  She was not hypotensive.  [facility] x-rays were obtained without bedside evidence of acute abnormality.  She will get CT of the head neck, and chest/abdomen/pelvis given all of the bruising to the left torso.  If workup reassuring, anticipate cleaning her wound and managing the wound.  If workup reassuring, anticipate discharge.  Workup returned without significant traumatic injury seen on CT imaging of the head, neck or chest/abdomen/pelvis.  We did discuss some of the findings on the CT abdomen pelvis that did not appear focally traumatic and she was not having further tenderness on exam.  She will follow-up with PCP for possible outpatient imaging if needed.  The hematoma on her head was associate with the laceration.  6 sutures were placed and it to help maintain the tension and bring the skin together.  Patient's tetanus shot was up-to-date.  After management, patient agrees with plan for discharge home.  She will follow-up with PCP and understood return precautions.  She instructed on follow-up for suture removal and patient was discharged in stable condition with family.         Final Clinical Impression(s) / ED Diagnoses Final diagnoses:  Fall, initial encounter  Injury of head, initial encounter  Laceration of scalp, initial encounter    Rx / DC Orders ED Discharge Orders     None       Clinical Impression: 1. Fall, initial encounter   2. Injury of head, initial encounter   3. Laceration of scalp, initial encounter     Disposition:  Discharge  Condition: Good  I have discussed the results, Dx and Tx plan with the pt(& family if present). He/she/they expressed understanding and agree(s) with the plan. Discharge instructions discussed at great length. Strict return precautions discussed and pt &/or family have verbalized understanding of the instructions. No further questions at time of discharge.    Discharge Medication List as of 08/24/2023  3:11 PM      Follow Up: Mandy Second, PA [address] [place] [address] [phone]  [facility] Emergency Department at [facility] [address] [place]  [address] [phone]       Edmund Holcomb, Marine Sia, MD 08/24/23 [phone]

## 2023-09-02 ENCOUNTER — Ambulatory Visit: Payer: Self-pay | Admitting: Podiatry

## 2023-09-02 ENCOUNTER — Encounter: Payer: Self-pay | Admitting: Podiatry

## 2023-09-02 DIAGNOSIS — B351 Tinea unguium: Secondary | ICD-10-CM | POA: Diagnosis not present

## 2023-09-02 DIAGNOSIS — E1142 Type 2 diabetes mellitus with diabetic polyneuropathy: Secondary | ICD-10-CM

## 2023-09-02 DIAGNOSIS — M79675 Pain in left toe(s): Secondary | ICD-10-CM | POA: Diagnosis not present

## 2023-09-02 DIAGNOSIS — M79674 Pain in right toe(s): Secondary | ICD-10-CM | POA: Diagnosis not present

## 2023-09-02 NOTE — Progress Notes (Signed)
  Subjective:  Patient ID: Heather Luna, female    DOB: [birth year],   MRN: [id number]  Chief Complaint  Patient presents with   [facility]    Rm9/ DFC/ diabetic A1c 6.8/blood sugar 130    73 y.o. female presents for concern of thickened elongated and painful nails that are difficult to trim. Requesting to have them trimmed today. Denies burning and tingling in their feet. Patient is diabetic and last A1c was  Lab Results  Component Value Date   HGBA1C 6.8 (H) 07/25/2023   .   PCP:  Mandy Second, PA    . Denies any other pedal complaints. Denies n/v/f/c.   Past Medical History:  Diagnosis Date   Anxiety    Arthritis    Cataract    Chronic UTI    Depression    Diabetes mellitus without complication (HCC)    Seizures (HCC)    Stroke (HCC)     Objective:  Physical Exam: Vascular: DP/PT pulses 2/4 bilateral. CFT <3 seconds. Absent hair growth on digits. Edema noted to bilateral lower extremities. Xerosis noted bilaterally.  Skin. No lacerations or abrasions bilateral feet. Nails 1-5 bilateral  are thickened discolored and elongated with subungual debris.  Musculoskeletal: MMT 5/5 bilateral lower extremities in DF, PF, Inversion and Eversion. Deceased ROM in DF of ankle joint.  Neurological: Sensation intact to light touch. Protective sensation diminished bilateral.    Assessment:   1. Pain due to onychomycosis of toenails of both feet   2. Type 2 diabetes mellitus with peripheral neuropathy (HCC)      Plan:  Patient was evaluated and treated and all questions answered. -Discussed and educated patient on diabetic foot care, especially with  regards to the vascular, neurological and musculoskeletal systems.  -Stressed the importance of good glycemic control and the detriment of not  controlling glucose levels in relation to the foot. -Discussed supportive shoes at all times and checking feet regularly.  -Mechanically debrided all nails 1-5 bilateral using sterile nail  nipper and filed with dremel without incident  -Answered all patient questions -Patient to return  in 3 months for at risk foot care -Patient advised to call the office if any problems or questions arise in the meantime.   Jennefer Moats, DPM

## 2023-09-12 ENCOUNTER — Other Ambulatory Visit: Payer: Self-pay

## 2023-09-12 MED ORDER — LAMOTRIGINE 25 MG PO TABS: 75.0000 mg | ORAL_TABLET | Freq: Two times a day (BID) | ORAL | 1 refills | Status: DC

## 2023-09-13 ENCOUNTER — Telehealth: Payer: Self-pay

## 2023-09-13 NOTE — Telephone Encounter (Signed)
 Oh my gosh - yes please those should be removed.

## 2023-09-13 NOTE — Telephone Encounter (Signed)
 Attempted call to [name] -  Left a detailed voice mail message that the  skilled nursing request for removal of stitches has been approved. As I did not speak directly with [name] - I attempted a call to the patient to inform her that stitches do need to come out - has been around a month and that verbal orders were left to do so with [facility].

## 2023-09-13 NOTE — Telephone Encounter (Signed)
 Copied from CRM [id number]. Topic: Clinical - Home Health Verbal Orders >> Sep 12, 2023  4:30 PM Burnard DEL wrote: Caller/Agency: [name] Callback Number: [phone] Service Requested: Skilled Nursing Frequency: Any new concerns about the patient? Yes Nurse from [facility] would like a verbal order to remove stitches out of patient head that have been there for a month now.

## 2023-10-24 ENCOUNTER — Ambulatory Visit: Payer: Medicare Other | Admitting: Urgent Care

## 2023-10-28 ENCOUNTER — Other Ambulatory Visit: Payer: Self-pay | Admitting: Urgent Care

## 2023-11-08 ENCOUNTER — Other Ambulatory Visit: Payer: Self-pay | Admitting: Urgent Care

## 2023-11-08 NOTE — Telephone Encounter (Signed)
 Copied from CRM [id number]. Topic: Clinical - Medication Refill >> Nov 08, 2023 11:25 AM Merlynn A wrote: Medication: citalopram  (CELEXA ) 20 MG tablet  Has the patient contacted their pharmacy? Yes (Agent: If no, request that the patient contact the pharmacy for the refill. If patient does not wish to contact the pharmacy document the reason why and proceed with request.) (Agent: If yes, when and what did the pharmacy advise?)  [facility] pharmacy called to request refill for medication for patient. Patient is out of medication.  This is the patient's preferred pharmacy:  [facility] [address] [place] - [address] [place] [address] Phone: [phone] Fax: [phone]   Is this the correct pharmacy for this prescription? Yes If no, delete pharmacy and type the correct one.   Has the prescription been filled recently? No  Is the patient out of the medication? Yes  Has the patient been seen for an appointment in the last year OR does the patient have an upcoming appointment? Yes  Can we respond through MyChart? Yes  Agent: Please be advised that Rx refills may take up to 3 business days. We ask that you follow-up with your pharmacy.

## 2023-11-11 ENCOUNTER — Other Ambulatory Visit: Payer: Self-pay | Admitting: Urgent Care

## 2023-11-12 MED ORDER — CITALOPRAM HYDROBROMIDE 20 MG PO TABS: 20.0000 mg | ORAL_TABLET | Freq: Every day | ORAL | 0 refills | Status: DC

## 2023-11-19 ENCOUNTER — Other Ambulatory Visit: Payer: Self-pay

## 2023-11-19 MED ORDER — LAMOTRIGINE 25 MG PO TABS: 75.0000 mg | ORAL_TABLET | Freq: Two times a day (BID) | ORAL | 1 refills | Status: DC

## 2023-11-22 ENCOUNTER — Other Ambulatory Visit: Payer: Self-pay | Admitting: Urgent Care

## 2023-11-22 ENCOUNTER — Other Ambulatory Visit: Payer: Self-pay

## 2023-11-29 ENCOUNTER — Other Ambulatory Visit (HOSPITAL_BASED_OUTPATIENT_CLINIC_OR_DEPARTMENT_OTHER): Payer: Self-pay

## 2023-11-29 ENCOUNTER — Other Ambulatory Visit: Payer: Self-pay

## 2023-11-29 ENCOUNTER — Other Ambulatory Visit: Payer: Self-pay | Admitting: Urgent Care

## 2023-12-02 ENCOUNTER — Ambulatory Visit: Admitting: Podiatry

## 2023-12-03 ENCOUNTER — Other Ambulatory Visit: Payer: Self-pay | Admitting: Urgent Care

## 2023-12-03 ENCOUNTER — Other Ambulatory Visit: Payer: Self-pay

## 2023-12-03 ENCOUNTER — Other Ambulatory Visit (HOSPITAL_BASED_OUTPATIENT_CLINIC_OR_DEPARTMENT_OTHER): Payer: Self-pay

## 2023-12-04 ENCOUNTER — Ambulatory Visit: Payer: Self-pay

## 2023-12-04 ENCOUNTER — Ambulatory Visit: Payer: Self-pay | Admitting: Urgent Care

## 2023-12-04 MED ORDER — ASPIRIN-DIPYRIDAMOLE ER 25-200 MG PO CP12: 1.0000 | ORAL_CAPSULE | Freq: Two times a day (BID) | ORAL | 1 refills | Status: DC

## 2023-12-04 NOTE — Telephone Encounter (Signed)
 Yes finishd prednisone , I would recommend wait until hives resolve before restarting cephalexin . What is she on it for?

## 2023-12-04 NOTE — Telephone Encounter (Signed)
 FYI Only or Action Required?: Action required by provider: clinical question for provider and update on patient condition.  Patient was last seen in primary care on 07/25/2023 by Lowella Benton CROME, PA.  Called Nurse Triage reporting Urticaria.  Symptoms began several days ago with hives and a week ago with increased falls.  Interventions attempted: Prescription medications: prednisone , and stopped cephalexin .  Symptoms are: hives to bilateral shins and left arm gradually improving and now resolved in right leg and left arm, only 2 hives left on left shin and light pink color; 3 falls in the past week (Monday and Tuesday patient was lying on the ground for hours; Saturday fall and was able to use her life alert and had EMS come out and did not require ED).  Triage Disposition: See PCP Within 2 Weeks- daughter declined appt with providers tomorrow or Friday due to she states patient would like to see PCP, strict ED precautions given  Patient/caregiver understands and will follow disposition?: Yes                 Copied from CRM [id number]. Topic: Clinical - Red Word Triage >> Dec 04, 2023  2:25 PM Heather Luna wrote: [facility] that prompted transfer to Nurse Triage: Patient daughter Heather Luna calling. Patient has been on cephalexin  and the refill last week was from an alternate manufacturer. She took it Saturday and Sunday and broke out in hives. She had prednisone  from a previous script (about 2 months ago) and the pharmacist had advised patient to take this. She is on day 3 of taking the prednisone  and the hives have almost cleared up. She has not taken the cephalexin  since Sunday.  Patient had 2 falls last week and one on Saturday. EMS came out and triaged on Saturday and patient daughter was able to help her up last week. No injury reported. Reason for Disposition  Localized hives  [1] Unable to get up until help (e.g., caregiver, family, friend) arrived AND [2] on the ground 1 hour or  more  Answer Assessment - Initial Assessment Questions 1. APPEARANCE: What does the rash look like?      Start red, itchy and looked like carpet burn. Red and irritated. Light pink.  2. LOCATION: Where is the rash located?      Legs (shins) and left arm. Resolved on left arm now and improved on legs. Daughter thinks they are only present on left leg now.  3. NUMBER: How many hives are there?      Maybe 2.  4. SIZE: How big are the hives? (e.g., inches, cm, compare to coins) Do they all look the same or do they vary in shape and size?      [place] 2-3 inches longs and 1 inch wide.  5. ONSET: When did the hives begin? (e.g., hours or days ago)      Sunday.  6. ITCHING: Does it itch? If Yes, ask: How bad is the itch?  (e.g., none, mild, moderate, severe)     No, not currently.  7. RECURRENT PROBLEM: Have you had hives before? If Yes, ask: When was the last time? and What happened that time?      Yes, she had hives when she took Cipro  in May 2025.  8. TRIGGERS: Were you exposed to any new food, plant, cosmetic product or animal just before the hives began?     Cephalexin  (has been taking for months, almost a year). Picked up the prescription on Friday, gave it  to patient on Saturday. It was from a Science writer. Pharmacy advised patient to take prednisone  which she had from her allergic reaction in May. Since taking prednisone  (Monday, Tuesday and today), hives have rapidly improved. Daughter states there is one prednisone  left and would like to know if patient should continue the medication.  9. OTHER SYMPTOMS: Do you have any other symptoms? (e.g., fever, tongue swelling, difficulty breathing, abdomen pain)     Diarrhea (on Monday, treated with Imodium and resolved since Monday night), falls (3 falls in the past week). Patient had a fall last Monday and was found Tuesday morning by her daughter on the floor (had been on the floor all night), had another  fall the next day and was on the floor for 7 hours, fell again in the bathroom on Saturday and was checked by EMS. Denies difficulty breathing, facial or tongue swelling, dehydration/dark brown or red colored urine.  10. PREGNANCY: Is there any chance you are pregnant? When was your last menstrual period?       N/A.  Protocols used: Hives-A-AH, Falls and Falling-A-AH

## 2023-12-04 NOTE — Telephone Encounter (Signed)
 Meds ordered this encounter  Medications   dipyridamole -aspirin  (AGGRENOX ) 200-25 MG 12hr capsule    Sig: Take 1 capsule by mouth 2 (two) times daily.    Dispense:  180 capsule    Refill:  1

## 2023-12-05 ENCOUNTER — Other Ambulatory Visit (HOSPITAL_BASED_OUTPATIENT_CLINIC_OR_DEPARTMENT_OTHER): Payer: Self-pay

## 2023-12-05 NOTE — Telephone Encounter (Signed)
 Left detailed voice mail message on patient listed home # ( allowed on DPR )  Left our return call back information to see what the cephalexin  was being prescribed for .

## 2023-12-07 ENCOUNTER — Other Ambulatory Visit: Payer: Self-pay | Admitting: Urgent Care

## 2023-12-09 NOTE — Telephone Encounter (Signed)
 Attempted call to patient. Left a voice mail message requesting a return call.

## 2023-12-10 ENCOUNTER — Other Ambulatory Visit: Payer: Self-pay | Admitting: Urgent Care

## 2023-12-10 DIAGNOSIS — N390 Urinary tract infection, site not specified: Secondary | ICD-10-CM

## 2023-12-10 DIAGNOSIS — R339 Retention of urine, unspecified: Secondary | ICD-10-CM

## 2023-12-11 NOTE — Telephone Encounter (Signed)
 Unable to connect with patient ( patient is previous [facility] patient not seen at [facility])

## 2023-12-11 NOTE — Telephone Encounter (Signed)
 Attempted call again to patient. left another voicemail message requesting a return call.

## 2023-12-16 ENCOUNTER — Ambulatory Visit (INDEPENDENT_AMBULATORY_CARE_PROVIDER_SITE_OTHER): Admitting: Urgent Care

## 2023-12-16 ENCOUNTER — Other Ambulatory Visit: Payer: Self-pay | Admitting: Urgent Care

## 2023-12-16 VITALS — BP 91/61 | HR 69 | Ht 62.0 in | Wt 178.0 lb

## 2023-12-16 DIAGNOSIS — R7989 Other specified abnormal findings of blood chemistry: Secondary | ICD-10-CM

## 2023-12-16 DIAGNOSIS — N1831 Chronic kidney disease, stage 3a: Secondary | ICD-10-CM

## 2023-12-16 DIAGNOSIS — I63521 Cerebral infarction due to unspecified occlusion or stenosis of right anterior cerebral artery: Secondary | ICD-10-CM

## 2023-12-16 DIAGNOSIS — Z8669 Personal history of other diseases of the nervous system and sense organs: Secondary | ICD-10-CM

## 2023-12-16 DIAGNOSIS — N390 Urinary tract infection, site not specified: Secondary | ICD-10-CM | POA: Diagnosis not present

## 2023-12-16 DIAGNOSIS — E1149 Type 2 diabetes mellitus with other diabetic neurological complication: Secondary | ICD-10-CM

## 2023-12-16 DIAGNOSIS — R296 Repeated falls: Secondary | ICD-10-CM

## 2023-12-16 DIAGNOSIS — I69352 Hemiplegia and hemiparesis following cerebral infarction affecting left dominant side: Secondary | ICD-10-CM

## 2023-12-16 DIAGNOSIS — G9389 Other specified disorders of brain: Secondary | ICD-10-CM

## 2023-12-16 DIAGNOSIS — R339 Retention of urine, unspecified: Secondary | ICD-10-CM

## 2023-12-16 DIAGNOSIS — R531 Weakness: Secondary | ICD-10-CM

## 2023-12-16 DIAGNOSIS — K7460 Unspecified cirrhosis of liver: Secondary | ICD-10-CM

## 2023-12-16 DIAGNOSIS — I6930 Unspecified sequelae of cerebral infarction: Secondary | ICD-10-CM

## 2023-12-16 MED ORDER — ATORVASTATIN CALCIUM 40 MG PO TABS: 40.0000 mg | ORAL_TABLET | Freq: Every day | ORAL | 1 refills | Status: AC

## 2023-12-16 MED ORDER — CITALOPRAM HYDROBROMIDE 20 MG PO TABS: 20.0000 mg | ORAL_TABLET | Freq: Every day | ORAL | 0 refills | Status: DC

## 2023-12-16 MED ORDER — LAMOTRIGINE 25 MG PO TABS: 75.0000 mg | ORAL_TABLET | Freq: Two times a day (BID) | ORAL | 1 refills | Status: DC

## 2023-12-16 MED ORDER — METHENAMINE HIPPURATE 1 G PO TABS: 1.0000 g | ORAL_TABLET | Freq: Two times a day (BID) | ORAL | 3 refills | Status: AC

## 2023-12-16 MED ORDER — METFORMIN HCL 500 MG PO TABS: 500.0000 mg | ORAL_TABLET | Freq: Two times a day (BID) | ORAL | 1 refills | Status: DC

## 2023-12-16 MED ORDER — TAMSULOSIN HCL 0.4 MG PO CAPS: 0.4000 mg | ORAL_CAPSULE | Freq: Every day | ORAL | 1 refills | Status: AC

## 2023-12-16 NOTE — Progress Notes (Unsigned)
 Heather Luna

## 2023-12-16 NOTE — Patient Instructions (Signed)
 Continue all medications as ordered. Please try to increase your water intake daily.  Get a blood pressure cuff and monitor at home. If it is consistently low, we can start a medication to help with this.  Please return in 3 months for follow up, sooner if needed

## 2023-12-16 NOTE — Progress Notes (Deleted)
 NEUROLOGY FOLLOW UP OFFICE NOTE  Heather Luna [id number]  Subjective:  Heather Luna is a 73 y.o. year old left-handed female with a medical history of stroke (1995 and 2007, right hemisphere) c/b seizure and left sided spasticity, DM, HLD, breast cancer, cirrhosis c/b thrombocytopenia who we last saw on 02/13/23 for stroke and seizure.  To briefly review: 02/13/23: Patient had a stroke in 1995. She has previously been seen by neurology at [facility] in 2019. 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 Her most recent MRI of the brain showed expected evolution of right ACA, right MCA, and bilateral (L>R) cerebellar/PICA infarcts. The most recent MRA head showed occlusion of the right A2 segment of the ACA, abnormalities of the right distal ACA. She also has a history of complex partial seizures for which she takes lamotrigine . She is referred for questions regarding her antiplatelet therapy. One of her strokes occurred while she was taking aspirin  and clopidogrel, and she has been maintained on aspirin -dipyridamole  ever since.   Medical co-morbidities noted to be contributing to the patient's stroke risk include the following: hyperlipidemia, prior strokes  Interval history:  Obtained history from: patient Patient caretaker: n/a (daughter helps out) Heather Luna presents to [facility] today, accompanied by her daughter. During today's encounter, the the following history is added:   She endorses a history of what she describes as TIAs, most recently in 2012 with unusual behaviors (playing with dish towels, for instance), but none in the last 4-5 years. Regarding her antiplatelet medication, she was initially taking clopidogrel after her stroke in  1995, and then was taken off this medication for about 10 days or so prior to her surgery. After the second stroke she was started on Aggrenox , which she continues to take. However, the branded drug has become too expensive and the question is whether should she should switch to the generic brand.   The patient is interested in restarting PT. 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 She wears a brace due to foot drop on that side. She endorses pain at night in the left ankle and foot, as if someone were pulling on it.    At one point patient was evaluated in a [place]  ED and started on Lamictal  due to concern for seizures. Patient was found in the kitchen confused by her sister. She had EEG and per patient, they thought it was a mini-stroke. Per patient Lamictal  was started in [place] when she returned. Patient has been on this for about 10 years. She is on 75 mg twice daily. There has been no concern for seizure since.    Daughter has some concerns about her mothers decision making and safety. She has spent money she does not have. She has significant weakness and spasticity of her left side. She has frequent falls, about 1 time per week. It usually happens when she is bending over and will lose her balance. She denies numbness, tingling, or pain.    She is currently on aggrenox  25-200 mg twice daily and atorvastatin  40 mg daily. Of note, on B complex.  Most recent Assessment and Plan (02/13/23): Heather  LASHAWNDA Luna is a 73 y.o. female who presents for history of stroke c/b left sided weakness, numbness, and spasticity, possible history of seizures, and imbalance and falls. She has a relevant medical history of stroke (1995 and 2007, right hemisphere) c/b seizure and left sided spasticity, DM, HLD, breast cancer, cirrhosis c/b thrombocytopenia. 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 Available diagnostic data is significant for CT head showing extensive encephalomalacia in right ACA and MCA territories 2/2 prior infarct. Her left sided weakness, numbness, and spasticity are residuals from her prior stroke. We discussed treatment for spasticity including medication (such as baclofen) or botox. I am concerned a medication like baclofen may cause more generalized weakness and could contribute to her imbalance. Botox would likely be a better option. Patient would prefer to complete more PT and then decide if botox is needed.    In terms of her seizure history, this is unclear to me. Her previous neurologist mentions a history of partial seizures, but patient only recalls one episode of confusion in the past. She has been on Lamictal  for ~10 years without concern for additional episodes. We agreed to get EEG then discuss whether tapering Lamictal  makes sense.   PLAN: -1 hour EEG -Continue Lamictal  75 mg BID for now -Continue Aggrenox  -Continue atorvastatin  40 mg daily -Continue PT/OT for home health; will send a new referral if needed. Patient to discuss with PT -Discussed botox for spasticity, patient would like to continue try more PT first  Since their last visit: EEG showed focal slowing over right hemisphere. I called and messaged daughter as requested but did not get a response. I recommend that patient continue lamictal  due to EEG findings.  Late and rescheduled on 08/22/23***  Recurrent falls?***  ***  MEDICATIONS:  Outpatient Encounter Medications as of 12/26/2023  Medication Sig   atorvastatin  (LIPITOR) 40 MG tablet Take 1 tablet (40 mg total) by mouth at bedtime.   b complex vitamins tablet Take 1 tablet by mouth daily.   citalopram  (CELEXA ) 20 MG tablet Take 1 tablet (20 mg total) by mouth daily. NEEDS APPOINTMENT FOR FURTHER REFILLS.   CRANBERRY PO Take 1  tablet by mouth daily.   dipyridamole -aspirin  (AGGRENOX ) 200-25 MG 12hr capsule Take 1 capsule by mouth 2 (two) times daily.   Fluocinolone  Acetonide 0.01 % OIL Place 5 drops in ear(s) 2 (two) times daily as needed.   lactulose  (CHRONULAC ) 10 GM/15ML solution Take 15-30 ml up to twice daily for constipation and confusion from cirrhosis, aim for 2 bowel movements a day   lamoTRIgine  (LAMICTAL ) 25 MG tablet Take 3 tablets (75 mg total) by mouth 2 (two) times daily.   metFORMIN  (GLUCOPHAGE ) 500 MG tablet Take 1 tablet (500 mg total) by mouth 2 (two) times daily with a meal.   methenamine  (HIPREX ) 1 g tablet Take 1 tablet (1 g total) by mouth 2 (two) times daily with a meal.   nystatin  (MYCOSTATIN /NYSTOP ) powder Apply 1 Application topically 3 (three) times daily.   tamsulosin  (FLOMAX ) 0.4 MG CAPS capsule Take 1 capsule (0.4 mg total) by mouth daily.   No facility-administered encounter medications on file as of 12/26/2023.    PAST MEDICAL HISTORY: Past Medical History:  Diagnosis Date   Anxiety    Arthritis    Cataract    Chronic UTI    Depression    Diabetes mellitus without complication (HCC)    Seizures (HCC)  Stroke [facility])     PAST SURGICAL HISTORY: Past Surgical History:  Procedure Laterality Date   ABDOMINAL HYSTERECTOMY     APPENDECTOMY     BREAST SURGERY     left breast   CHOLECYSTECTOMY     EYE SURGERY     left cataract   FRACTURE SURGERY     R foot   MASTECTOMY Left 2007    ALLERGIES: Allergies  Allergen Reactions   Septra [Sulfamethoxazole-Trimethoprim] Hives   Ciprofloxacin  Rash   Levetiracetam Other (See Comments)    Crying and irritabliity    FAMILY HISTORY: Family History  Problem Relation Age of Onset   Rheum arthritis Mother    Cancer Mother    Heart failure Father    Heart attack Father    Breast cancer Paternal Grandmother     SOCIAL HISTORY: Social History   Tobacco Use   Smoking status: Never   Smokeless tobacco: Never  Vaping Use    Vaping status: Never Used  Substance Use Topics   Alcohol use: Never   Drug use: Never   Social History   Social History Narrative   Are you right handed or left handed? Left handed but is now right handed   Are you currently employed ?    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    What type of home do you live in: 1 story or 2 story? one    Caffeine 1/2 cup coffee a day      Objective:  Vital Signs:  There were no vitals taken for this visit.  ***  Labs and Imaging review: New results:*** 07/25/23: Lipid panel: tChol 113, LDL 49, TG 86.0 TSH wnl HbA1c: 6.8   EEG (02/22/23): Description: The patient is awake and asleep during the recording.  During maximal wakefulness, there is a symmetric, medium voltage 9 Hz posterior dominant rhythm that attenuates with eye opening.  There is continuous polymorphic theta and delta slowing over the right hemisphere, maximal over the right frontocentrotemporal regions, at times sharply contoured without clear epileptogenic potential. During drowsiness and sleep, there is an increase in theta slowing of the background. Vertex waves and symmetric sleep spindles were seen. Photic stimulation did not elicit any abnormalities.  There were no clear epileptiform discharges or electrographic seizures seen.     EKG lead was unremarkable.   Impression: This 1-hour awake and asleep EEG is abnormal due to focal slowing over the right hemisphere.   Clinical Correlation of the above findings indicates focal cerebral dysfunction over the right hemisphere consistent with prior stroke in this region. The absence of epileptiform discharges does not exclude a clinical diagnosis of epilepsy. Clinical correlation is advised.   CT head wo contrast (07/01/23): FINDINGS: Brain: Extensive right frontal lateral encephalomalacia is again noted with ex vacuo dilatation of the right lateral ventricle. Small old  infarcts in the bilateral cerebral hemispheres appear unchanged. There is mild periventricular white matter hypodensity, likely chronic small vessel ischemic change. There is no acute intracranial hemorrhage, extra-axial fluid collection, acute infarct or mass effect. There is no hydrocephalus.   Vascular: Atherosclerotic calcifications are present within the cavernous internal carotid arteries.   Skull: Normal. Negative for fracture or focal lesion.   Sinuses/Orbits: No acute finding.   Other: There is left parietal scalp soft tissue swelling and laceration.   IMPRESSION: 1. No acute intracranial process. 2. Left parietal scalp soft tissue swelling and laceration.  3. Stable chronic changes.   CT cervical spine wo contrast (07/01/23): FINDINGS: Alignment: Mild reversal of the normal cervical lordosis is noted.   Skull base and vertebrae: 7 cervical segments are well visualized. Vertebral body height is well maintained. Mild osteophytic changes and facet hypertrophic changes are noted. No acute fracture or acute facet abnormality is noted.   Soft tissues and spinal canal: Surrounding soft tissue structures are within normal limits.   Upper chest: Visualized lung apices are within normal limits.   Other: None.   IMPRESSION: Multilevel degenerative changes without acute bony abnormality.  CT head and cervical spine wo contrast (08/24/23): IMPRESSION: 1. No acute intracranial abnormality. 2. No acute displaced fracture or traumatic listhesis of the cervical spine. 3. A 1.9 cm left parieto-occipital scalp hematoma. No retained radiopaque foreign body.   Previously reviewed results:      Lab Results  Component Value Date    HGBA1C 6.3 (A) 01/02/2023    HGBA1C 6.3 01/02/2023    HGBA1C 6.3 01/02/2023    HGBA1C 6.3 01/02/2023      Recent Labs           Lab Results  Component Value Date    VITAMINB12 404 01/02/2023      Recent Labs[] Expand by Default            Lab Results  Component Value Date    TSH 1.15 01/02/2023      01/02/23: CBC w/ diff significant for thrombocytopenia (platelets 83 - chronic) CMP significant for glucose of 121 Folate wnl Lipid panel: Component     Latest Ref Rng 01/02/2023  Cholesterol     0 - 200 mg/dL 88   Triglycerides     0.0 - 149.0 mg/dL 897.9   HDL Cholesterol     >39.00 mg/dL 61.59 (L)   VLDL     0.0 - 40.0 mg/dL 79.5   LDL (calc)     0 - 99 mg/dL 30   Total CHOL/HDL Ratio 2   NonHDL 50.04       Imaging: CT head and cervical spine wo contrast (12/30/22): FINDINGS: CT HEAD FINDINGS   Brain: No acute hemorrhage. 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 Unchanged old perforator infarcts in the left caudate, right thalamus and bilateral cerebellar hemispheres. No hydrocephalus or extra-axial collection. No mass effect or midline shift.   Vascular: No hyperdense vessel or unexpected calcification.   Skull: No calvarial fracture or suspicious bone lesion. Skull base is unremarkable.   Sinuses/Orbits: No acute finding.   Other: None.   CT CERVICAL SPINE FINDINGS   Alignment: Unchanged 3 mm degenerative anterolisthesis of C4 on C5. No traumatic malalignment.   Skull base and vertebrae: No acute fracture. Normal craniocervical junction. No suspicious bone lesions.   Soft tissues and spinal canal: No prevertebral fluid or swelling. No visible canal hematoma.   Disc levels: Unchanged multilevel cervical spondylosis without high-grade spinal canal stenosis.   Upper chest: No acute findings.   Other: None.   IMPRESSION: 1. No acute intracranial abnormality. 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 2. No acute cervical spine fracture or traumatic malalignment.   CT head wo contrast (08/13/2018): IMPRESSION: 1. No acute intracranial abnormality detected. 2. Left posterior scalp swelling without  evidence of a calvarial defect. 3. Large old right frontal lobe infarct. Probable old infarct involving the left cerebellum.  Assessment/Plan:  This is CINDRA AUSTAD, a 73 y.o. female with: ***  Plan: ***  Return to clinic in ***  Total time spent reviewing records, interview, history/exam, documentation, and coordination of care on day of encounter:  *** min  Venetia Potters, MD

## 2023-12-16 NOTE — Progress Notes (Unsigned)
 Established Patient Office Visit  Subjective:  Patient ID: Heather Luna, female    DOB: Aug 09, 1950  Age: 73 y.o. MRN: [id number]  Chief Complaint  Patient presents with  . Follow-up    falls    HPI  Patient Active Problem List   Diagnosis Date Noted  . Thrombocytopenia 01/18/2023  . Stage 3a chronic kidney disease (HCC) 01/18/2023  . Depression, recurrent 01/02/2023  . Type 2 diabetes mellitus with neurological complications (HCC) 01/02/2023  . 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 Hepatic cirrhosis (HCC) 01/02/2023  . Recurrent falls 01/02/2023  . History of seizure disorder 01/02/2023  . Encephalomalacia with cerebral infarction (HCC) 01/02/2023  . Recurrent UTI 09/24/2013  . Absence of bladder continence 04/23/2012  . Closed fracture of phalanx of foot 02/20/2012  . Epilepsy (HCC) 07/03/2011  . CVA (cerebral vascular accident) (HCC) 07/26/2005   Past Medical History:  Diagnosis Date  . Anxiety   . Arthritis   . Cataract   . Chronic UTI   . Depression   . Diabetes mellitus without complication (HCC)   . Seizures (HCC)   . Stroke [facility])    Past Surgical History:  Procedure Laterality Date  . ABDOMINAL HYSTERECTOMY    . APPENDECTOMY    . BREAST SURGERY     left breast  . CHOLECYSTECTOMY    . EYE SURGERY     left cataract  . FRACTURE SURGERY     R foot  . MASTECTOMY Left 2007   Social History   Tobacco Use  . Smoking status: Never  . Smokeless tobacco: Never  Vaping Use  . Vaping status: Never Used  Substance Use Topics  . Alcohol use: Never  . Drug use: Never      ROS: as noted in HPI  Objective:     BP 91/61   Pulse 69   Ht 5' 2 (1.575 m)   Wt 178 lb (80.7 kg)   SpO2 98%   BMI 32.56 kg/m  BP Readings from Last 3 Encounters:  12/16/23 91/61  08/24/23 129/86  07/25/23 114/71   Wt Readings from Last 3 Encounters:  12/16/23 178 lb (80.7 kg)  08/24/23 145 lb 15.1 oz  (66.2 kg)  07/25/23 146 lb (66.2 kg)      Physical Exam   No results found for any visits on 12/16/23.  Last CBC Lab Results  Component Value Date   WBC 4.2 08/24/2023   HGB 9.4 (L) 08/24/2023   HCT 29.7 (L) 08/24/2023   MCV 93.4 08/24/2023   MCH 29.6 08/24/2023   RDW 16.2 (H) 08/24/2023   PLT 64 (L) 08/24/2023   Last metabolic panel Lab Results  Component Value Date   GLUCOSE 137 (H) 08/24/2023   NA 139 08/24/2023   K 5.0 08/24/2023   CL 106 08/24/2023   CO2 25 08/24/2023   BUN 22 08/24/2023   CREATININE 0.80 08/24/2023   GFRNONAA >60 08/24/2023   CALCIUM  9.1 08/24/2023   PROT 6.2 (L) 08/24/2023   ALBUMIN 3.7 08/24/2023   BILITOT 0.8 08/24/2023   ALKPHOS 69 08/24/2023   AST 30 08/24/2023   ALT 19 08/24/2023   ANIONGAP 8 08/24/2023   Last lipids Lab Results  Component Value Date   CHOL 113 07/25/2023   HDL 46.60 07/25/2023   LDLCALC 49 07/25/2023   TRIG 86.0 07/25/2023   CHOLHDL 2 07/25/2023   Last hemoglobin A1c Lab Results  Component Value Date  HGBA1C 6.8 (H) 07/25/2023   Last thyroid  functions Lab Results  Component Value Date   TSH 0.78 07/25/2023   Last vitamin D  Lab Results  Component Value Date   VD25OH 38 07/11/2023   Last vitamin B12 and Folate Lab Results  Component Value Date   VITAMINB12 404 01/02/2023   FOLATE >24.2 01/02/2023      The ASCVD Risk score (Arnett DK, et al., 2019) failed to calculate for the following reasons:   Risk score cannot be calculated because patient has a medical history suggesting prior/existing ASCVD  Assessment & Plan:  Recurrent falls  Urinary retention with incomplete bladder emptying  Recurrent UTI     No follow-ups on file.   Benton LITTIE Gave, PA

## 2023-12-17 ENCOUNTER — Ambulatory Visit (INDEPENDENT_AMBULATORY_CARE_PROVIDER_SITE_OTHER): Admitting: Podiatry

## 2023-12-17 ENCOUNTER — Ambulatory Visit: Payer: Self-pay | Admitting: Urgent Care

## 2023-12-17 ENCOUNTER — Encounter: Payer: Self-pay | Admitting: Podiatry

## 2023-12-17 DIAGNOSIS — M79675 Pain in left toe(s): Secondary | ICD-10-CM

## 2023-12-17 DIAGNOSIS — B351 Tinea unguium: Secondary | ICD-10-CM

## 2023-12-17 DIAGNOSIS — E1142 Type 2 diabetes mellitus with diabetic polyneuropathy: Secondary | ICD-10-CM

## 2023-12-17 DIAGNOSIS — M79674 Pain in right toe(s): Secondary | ICD-10-CM

## 2023-12-17 LAB — CBC WITH DIFFERENTIAL/PLATELET
Basophils Absolute: 0 x10E3/uL (ref 0.0–0.2)
Basos: 1 %
EOS (ABSOLUTE): 0.1 x10E3/uL (ref 0.0–0.4)
Eos: 2 %
Hematocrit: 33.5 % — ABNORMAL LOW (ref 34.0–46.6)
Hemoglobin: 10.3 g/dL — ABNORMAL LOW (ref 11.1–15.9)
Immature Grans (Abs): 0 x10E3/uL (ref 0.0–0.1)
Immature Granulocytes: 0 %
Lymphocytes Absolute: 0.8 x10E3/uL (ref 0.7–3.1)
Lymphs: 16 %
MCH: 28.5 pg (ref 26.6–33.0)
MCHC: 30.7 g/dL — ABNORMAL LOW (ref 31.5–35.7)
MCV: 93 fL (ref 79–97)
Monocytes Absolute: 0.4 x10E3/uL (ref 0.1–0.9)
Monocytes: 9 %
Neutrophils Absolute: 3.4 x10E3/uL (ref 1.4–7.0)
Neutrophils: 72 %
Platelets: 70 x10E3/uL — CL (ref 150–450)
RBC: 3.62 x10E6/uL — ABNORMAL LOW (ref 3.77–5.28)
RDW: 16.2 % — ABNORMAL HIGH (ref 11.7–15.4)
WBC: 4.7 x10E3/uL (ref 3.4–10.8)

## 2023-12-17 LAB — CMP14+EGFR
ALT: 16 IU/L (ref 0–32)
AST: 19 IU/L (ref 0–40)
Albumin: 4.1 g/dL (ref 3.8–4.8)
Alkaline Phosphatase: 80 IU/L (ref 49–135)
BUN/Creatinine Ratio: 18 (ref 12–28)
BUN: 14 mg/dL (ref 8–27)
Bilirubin Total: 0.5 mg/dL (ref 0.0–1.2)
CO2: 23 mmol/L (ref 20–29)
Calcium: 9.1 mg/dL (ref 8.7–10.3)
Chloride: 107 mmol/L — ABNORMAL HIGH (ref 96–106)
Creatinine, Ser: 0.79 mg/dL (ref 0.57–1.00)
Globulin, Total: 2 g/dL (ref 1.5–4.5)
Glucose: 119 mg/dL — ABNORMAL HIGH (ref 70–99)
Potassium: 4.4 mmol/L (ref 3.5–5.2)
Sodium: 142 mmol/L (ref 134–144)
Total Protein: 6.1 g/dL (ref 6.0–8.5)
eGFR: 79 mL/min/1.73 (ref >59)

## 2023-12-17 LAB — MICROALBUMIN / CREATININE URINE RATIO
Creatinine, Urine: 116.6 mg/dL
Microalb/Creat Ratio: 8 mg/g{creat} (ref 0–29)
Microalbumin, Urine: 9.3 ug/mL

## 2023-12-17 LAB — AMMONIA: Ammonia: 55 ug/dL (ref 31–169)

## 2023-12-17 NOTE — Progress Notes (Signed)
  Subjective:  Patient ID: Heather Luna, female    DOB: [birth year],   MRN: [id number]  Chief Complaint  Patient presents with   Diabetes    Toenail clipping Saw Benton Gave, PA - 12/16/2023; A1c - 6.8    73 y.o. female presents for concern of thickened elongated and painful nails that are difficult to trim. Requesting to have them trimmed today. Denies burning and tingling in their feet. Patient is diabetic and last A1c was  Lab Results  Component Value Date   HGBA1C 6.8 (H) 07/25/2023   .   PCP:  Gave Benton CROME, PA    . Denies any other pedal complaints. Denies n/v/f/c.   Past Medical History:  Diagnosis Date   Anxiety    Arthritis    Cataract    Chronic UTI    Depression    Diabetes mellitus without complication (HCC)    Seizures (HCC)    Stroke (HCC)     Objective:  Physical Exam: Vascular: DP/PT pulses 2/4 bilateral. CFT <3 seconds. Absent hair growth on digits. Edema noted to bilateral lower extremities. Xerosis noted bilaterally.  Skin. No lacerations or abrasions bilateral feet. Nails 1-5 bilateral  are thickened discolored and elongated with subungual debris.  Musculoskeletal: MMT 5/5 bilateral lower extremities in DF, PF, Inversion and Eversion. Deceased ROM in DF of ankle joint.  Neurological: Sensation intact to light touch. Protective sensation diminished bilateral.    Assessment:   1. Pain due to onychomycosis of toenails of both feet   2. Type 2 diabetes mellitus with peripheral neuropathy (HCC)      Plan:  Patient was evaluated and treated and all questions answered. -Discussed and educated patient on diabetic foot care, especially with  regards to the vascular, neurological and musculoskeletal systems.  -Stressed the importance of good glycemic control and the detriment of not  controlling glucose levels in relation to the foot. -Discussed supportive shoes at all times and checking feet regularly.  -Mechanically debrided all nails 1-5  bilateral using sterile nail nipper and filed with dremel without incident  -Answered all patient questions -Patient to return  in 3 months for at risk foot care -Patient advised to call the office if any problems or questions arise in the meantime.   [name] Failing, DPM

## 2023-12-18 ENCOUNTER — Encounter: Payer: Self-pay | Admitting: Urgent Care

## 2023-12-26 ENCOUNTER — Ambulatory Visit: Admitting: Neurology

## 2024-01-13 ENCOUNTER — Other Ambulatory Visit: Payer: Self-pay | Admitting: Urgent Care

## 2024-01-13 DIAGNOSIS — G9389 Other specified disorders of brain: Secondary | ICD-10-CM

## 2024-01-16 ENCOUNTER — Ambulatory Visit: Payer: Self-pay

## 2024-01-16 NOTE — Telephone Encounter (Signed)
 FYI Only or Action Required?: Action required by provider: clinical question for provider and Daughter asking if provider can placed an order for urine so patient can give a urine sample in the office tomorrow. Patient and daughter will be in the building for patient's PT session.  Patient was last seen in primary care on 12/16/2023 by Lowella Benton CROME, PA.  Called Nurse Triage reporting Urinary Urgency and Dysuria.  Symptoms began several days ago.  Interventions attempted: Rest, hydration, or home remedies.  Symptoms are: unchanged.  Triage Disposition: See Physician Within 24 Hours  Patient/caregiver understands and will follow disposition?: No, wishes to speak with PCP  Copied from CRM #[id number]. Topic: Clinical - Red Word Triage >> Jan 16, 2024 12:01 PM Darshell M wrote: Red Word that prompted transfer to Nurse Triage: Possible UTI, patient's daughter calling. Patient has fallen several times in last week. Patient complaining of increased urgency, pain with urination. Reason for Disposition  All other patients with painful urination  (Exception: [1] EITHER frequency or urgency AND [2] has on-call doctor.)  Answer Assessment - Initial Assessment Questions Call was placed to daughter who had hung up prior to Agent being able to transfer phone call.  Patient was recently taken off of Keflex  for a maintenance dose to prevent UTIs. Daughter states she noticed an odor to patient's urine and change in color. Reports urinary urgency and pain with urination. Daughter is asking if patient would be able to give a urine sample in the office tomorrow. Patient will be in the building for her PT session. Daughter Harlene is asking for a follow up call from office. [phone]  1. SEVERITY: How bad is the pain?  (e.g., Scale 1-10; mild, moderate, or severe)     moderate 2. FREQUENCY: How many times have you had painful urination today?      Daughter is unsure 3. PATTERN: Is pain present every  time you urinate or just sometimes?      yes 4. ONSET: When did the painful urination start?      Daughter is thinking symptoms started last week.  5. FEVER: Do you have a fever? If Yes, ask: What is your temperature, how was it measured, and when did it start?     no 6. PAST UTI: Have you had a urine infection before? If Yes, ask: When was the last time? and What happened that time?      yes 7. CAUSE: What do you think is causing the painful urination?  (e.g., UTI, scratch, Herpes sore)     Possible UTI 8. OTHER SYMPTOMS: Do you have any other symptoms? (e.g., blood in urine, flank pain, genital sores, urgency, vaginal discharge)     Urgency, odor to urine  Protocols used: Urination Pain - Female-A-AH

## 2024-01-17 ENCOUNTER — Ambulatory Visit (INDEPENDENT_AMBULATORY_CARE_PROVIDER_SITE_OTHER)

## 2024-01-17 ENCOUNTER — Other Ambulatory Visit: Payer: Self-pay

## 2024-01-17 ENCOUNTER — Ambulatory Visit: Attending: Urgent Care | Admitting: Physical Therapy

## 2024-01-17 ENCOUNTER — Encounter: Payer: Self-pay | Admitting: Physical Therapy

## 2024-01-17 VITALS — BP 108/52 | HR 66 | Temp 98.5°F | Ht 62.0 in

## 2024-01-17 DIAGNOSIS — I69352 Hemiplegia and hemiparesis following cerebral infarction affecting left dominant side: Secondary | ICD-10-CM | POA: Diagnosis not present

## 2024-01-17 DIAGNOSIS — I6930 Unspecified sequelae of cerebral infarction: Secondary | ICD-10-CM | POA: Diagnosis not present

## 2024-01-17 DIAGNOSIS — I639 Cerebral infarction, unspecified: Secondary | ICD-10-CM | POA: Insufficient documentation

## 2024-01-17 DIAGNOSIS — G9389 Other specified disorders of brain: Secondary | ICD-10-CM | POA: Diagnosis not present

## 2024-01-17 DIAGNOSIS — R300 Dysuria: Secondary | ICD-10-CM | POA: Diagnosis not present

## 2024-01-17 DIAGNOSIS — Z8669 Personal history of other diseases of the nervous system and sense organs: Secondary | ICD-10-CM | POA: Insufficient documentation

## 2024-01-17 DIAGNOSIS — R269 Unspecified abnormalities of gait and mobility: Secondary | ICD-10-CM | POA: Insufficient documentation

## 2024-01-17 DIAGNOSIS — R414 Neurologic neglect syndrome: Secondary | ICD-10-CM | POA: Insufficient documentation

## 2024-01-17 DIAGNOSIS — R2689 Other abnormalities of gait and mobility: Secondary | ICD-10-CM | POA: Diagnosis present

## 2024-01-17 DIAGNOSIS — R296 Repeated falls: Secondary | ICD-10-CM | POA: Diagnosis not present

## 2024-01-17 DIAGNOSIS — R531 Weakness: Secondary | ICD-10-CM | POA: Insufficient documentation

## 2024-01-17 LAB — POCT URINALYSIS DIP (CLINITEK)
Bilirubin, UA: NEGATIVE
Blood, UA: NEGATIVE
Glucose, UA: NEGATIVE mg/dL
Ketones, POC UA: NEGATIVE mg/dL
Nitrite, UA: NEGATIVE
POC PROTEIN,UA: NEGATIVE
Spec Grav, UA: 1.025 (ref 1.010–1.025)
Urobilinogen, UA: 0.2 U/dL
pH, UA: 6 (ref 5.0–8.0)

## 2024-01-17 MED ORDER — CITALOPRAM HYDROBROMIDE 20 MG PO TABS: 20.0000 mg | ORAL_TABLET | Freq: Every day | ORAL | 0 refills | Status: DC

## 2024-01-17 NOTE — Telephone Encounter (Signed)
 Patient came in for nurse visit today and  will keep upcoming appt scheduled for Monday 01/20/2024

## 2024-01-17 NOTE — Patient Instructions (Signed)
 Return at already scheduled visit on 01/20/2024 with Benton Gave, PA

## 2024-01-17 NOTE — Progress Notes (Signed)
   Established Patient Office Visit  Subjective   Patient ID: Heather Luna, female    DOB: 01-16-1951  Age: 73 y.o. MRN: [id number]  Chief Complaint  Patient presents with   Dysuria    Urinalysis and urine culture - nurse visit for upcoming appt scheduled Monday 01/20/2024    HPI  Dysuria and urine frequency- urinalysis and urine culture  for upcoming appt scheduled for 01/20/2024  ROS    Objective:     BP (!) 108/52   Pulse 66   Temp 98.5 F (36.9 C)   Ht 5' 2 (1.575 m)   SpO2 96%   BMI 32.56 kg/m    Physical Exam   Results for orders placed or performed in visit on 01/17/24  POCT URINALYSIS DIP (CLINITEK)  Result Value Ref Range   Color, UA yellow yellow   Clarity, UA clear clear   Glucose, UA negative negative mg/dL   Bilirubin, UA negative negative   Ketones, POC UA negative negative mg/dL   Spec Grav, UA 8.974 8.989 - 1.025   Blood, UA negative negative   pH, UA 6.0 5.0 - 8.0   POC PROTEIN,UA negative negative, trace   Urobilinogen, UA 0.2 0.2 or 1.0 E.U./dL   Nitrite, UA Negative Negative   Leukocytes, UA Small (1+) (A) Negative      The ASCVD Risk score (Arnett DK, et al., 2019) failed to calculate for the following reasons:   Risk score cannot be calculated because patient has a medical history suggesting prior/existing ASCVD    Assessment & Plan:  Urinalysis and urine culture collected. Patient has upcoming appt scheduled for 01/20/2024 .  Problem List Items Addressed This Visit   None Visit Diagnoses       Dysuria    -  Primary   Relevant Orders   POCT URINALYSIS DIP (CLINITEK) (Completed)   Urine Culture       Return in about 3 days (around 01/20/2024) for visit with Benton Gave.    Suzen SHAUNNA Plenty, LPN

## 2024-01-17 NOTE — Telephone Encounter (Signed)
 Yes that is fine  thanks

## 2024-01-17 NOTE — Therapy (Addendum)
 OUTPATIENT PHYSICAL THERAPY LOWER EXTREMITY EVALUATION   Patient Name: Heather Luna MRN: [id number] DOB:29-Feb-1951, 73 y.o., female Today's Date: 01/21/2024  END OF SESSION:    Past Medical History:  Diagnosis Date   Anxiety    Arthritis    Cataract    Chronic UTI    Depression    Diabetes mellitus without complication (HCC)    Seizures (HCC)    Stroke 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) 01/02/2023   Hepatic cirrhosis (HCC) 01/02/2023   Recurrent falls 01/02/2023   History of seizure disorder 01/02/2023   Encephalomalacia with cerebral infarction (HCC) 01/02/2023   Recurrent UTI 09/24/2013   Absence of bladder continence 04/23/2012   Closed fracture of phalanx of foot 02/20/2012   Epilepsy (HCC) 07/03/2011   CVA (cerebral vascular accident) (HCC) 07/26/2005    PCP: Benton Gave, PA  REFERRING PROVIDER: Benton Gave, PA  REFERRING DIAG:    R29.6 (ICD-10-CM) - Recurrent falls  G93.89,I63.9 (ICD-10-CM) - Encephalomalacia with cerebral infarction (HCC)  Z86.69 (ICD-10-CM) - History of seizure disorder  R53.1 (ICD-10-CM) - Asthenia  I69.352 (ICD-10-CM) - 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 She states she was wearing socks, not wearing her right leg brace, did not have her cane, and it was in the dark for the majority of the 4x. Pt uses a hemiwalker, SPC, wheel chair, and left leg brace. Pt is able to ambulate with min assist when at home, however requires a cane, WC, or alternate assist for longer ambulatory lengths. Pt presents to initial eval with her daughter who lives 12 min away, consistently checking in on pt, and will be bringing her to therapy. Pt appears with several bruises on left side arm and leg, stating she doesn't know when she runs into objects. She wants to improve her balance and get better with walking.   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 No  PRECAUTIONS: Fall  RED FLAGS: None   WEIGHT BEARING RESTRICTIONS: No  FALLS:  Has patient fallen in last 6 months? Yes. 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  DIAGNOSTIC FINDINGS:    Cervical CT Scan IMPRESSION: 1. No acute intracranial abnormality. 2. No acute displaced fracture or traumatic listhesis of the cervical spine. 3. A 1.9 cm left parieto-occipital scalp hematoma. No retained radiopaque foreign body.    COGNITION: Overall cognitive status: History of cognitive impairments - at baseline     SENSATION: Proprioception: Impaired      POSTURE: weight shift right    LOWER EXTREMITY ROM:  Active ROM Right eval Left eval  Hip flexion    Hip extension    Hip abduction    Hip adduction    Hip internal rotation    Hip external rotation    Knee flexion    Knee extension    Ankle dorsiflexion    Ankle plantarflexion    Ankle inversion    Ankle eversion     (Blank rows = not tested)  LOWER EXTREMITY MMT:  MMT Right eval Left eval  Hip flexion 4 4-  Hip extension    Hip abduction    Hip adduction    Hip internal rotation 5 3+  Hip external rotation 5 3  Knee flexion 5 4  Knee extension 5 4-  Ankle dorsiflexion    Ankle plantarflexion    Ankle inversion    Ankle eversion     (Blank rows = not tested)    FUNCTIONAL TESTS:  Berg Balance Scale:  Item Test date: 01/17/24 Date:  Date:   Sitting to standing 3. able to stand independently using hands Insert SmartPhrase OPRCBERGREEVAL Insert SmartPhrase OPRCBERGREEVAL  2. Standing unsupported 3. able to stand 2 minutes with supervision    3. Sitting with back unsupported, feet supported 3. able to sit 2 minutes under supervision    4. Standing to sitting 1. sits independently but has uncontrolled descent    5. Pivot transfer  3. able to transfer safely with definite need of hands    6. Standing unsupported with eyes closed 3. able to stand 10 seconds with supervision    7. Standing unsupported with feet together 0. needs help to attain position and unable to hold for 15 seconds    8. Reaching forward with outstretched arms while standing 2. can reach forward 5 cm (2 inches)    9. Pick up object from the floor from standing 3. able to pick up slipper but needs supervision    10. Turning to look behind over left and right shoulders while standing 2. turns sideways but only maintains balance    11. Turn 360 degrees 0. needs assistance while turning    12. Place alternate foot on step or stool while standing unsupported 1. able to complete > 2 steps  needs minimal assist    13. Standing unsupported one foot in front 0. loses balance while stepping or standing    14. Standing on one leg 0. unable to try of needs assist to prevent fall      Total Score 24/56 Total Score:    Total Score:      GAIT: Distance walked: 75 ft Assistive device utilized: Single point cane Level of assistance: Complete Independence Comments: Pt depends on SPC to walk and tends to right when moving left side through gait cycle; decreased hip/knee flexion during gait cycle  TREATMENT DATE:  [facility] Adult PT Treatment:                                                DATE: 01/17/24 Therapeutic Exercise: See HEP below HEP review      PATIENT EDUCATION:  Education details: Reviewed HEP and POC  Person educated: Patient and Child(ren) Education method: Explanation, Demonstration, Tactile cues, Verbal cues, and Handouts Education comprehension: verbalized understanding, returned demonstration, verbal cues required, tactile cues required, and needs further education  HOME EXERCISE PROGRAM: Access Code: [email or web address] URL: [email or web address] Date: 01/17/2024 Prepared by: Darice Conine Exercises - Sit to Stand with Armchair  - 2 x daily - 7 x weekly - 3 sets - 5 reps   ASSESSMENT:  CLINICAL IMPRESSION: Patient is a 73 y.o. female who was seen today for physical therapy evaluation and treatment for gait abmormality and left side neglect. Pt reports she has fallen 4x within the last week. She uses a hemiwalker, SPC, wheel chair, and left leg brace.  Pt presents to initial eval with her daughter who lives 12 min away, consistently checking in on pt, and will be bringing her to therapy. She wants to improve her balance and get better with walking. Upon evaluation pt presents with decreased strength in LLE and requires external assistive  device for ambulation. Pt able to pivot transfer with use of one UE only, and has 5/5 strength in RLE. Pt is pleasant and works well with her daughter. Pt unable to complete a tandem stance even with assistance. Pt will benefit from skilled PT to address balance deficits and other impairments listed below.   OBJECTIVE IMPAIRMENTS: Abnormal gait, decreased activity tolerance, decreased balance, decreased cognition, decreased coordination, decreased endurance, decreased knowledge of condition, decreased knowledge of use of DME, decreased mobility, difficulty walking, decreased ROM, decreased strength, decreased safety awareness, increased fascial restrictions, impaired flexibility, impaired UE functional use, improper body mechanics, postural dysfunction, and obesity.   ACTIVITY LIMITATIONS: carrying, lifting, bending, standing, squatting, sleeping, stairs, and transfers  PARTICIPATION LIMITATIONS: meal prep, cleaning, laundry, interpersonal relationship, driving, and community activity  PERSONAL FACTORS: Age, Behavior pattern, Fitness, Past/current experiences, Sex, Time since onset of injury/illness/exacerbation, Transportation, and 3+ comorbidities: Left neglect  are also affecting patient's functional outcome.   REHAB POTENTIAL: Fair Left neglect, severe decreased strength on left side   CLINICAL DECISION MAKING: Unstable/unpredictable  EVALUATION COMPLEXITY: High   GOALS: Goals reviewed with patient? Yes  SHORT TERM GOALS: Target date: 02/16/24 Pt will be efficient with initial HEP so she increases independence with everyday activities.  Baseline: Goal status: INITIAL  2.  Pt will be able to perform a sit to stand without UE assist so she increases independence with transfers.  Baseline:  Goal status: INITIAL  3.  Pt will reduce falls to <= 1 a week to demo improved safety and balance Baseline:  Goal status: INITIAL    LONG TERM GOALS: Target date: 03/13/24  Pt will be  efficient with advanced HEP so she increases independence with everyday activities.  Baseline:  Goal status: INITIAL  2.  Pt will improve Berg balance scale by 10 points so she is a decreased fall risk Baseline: 24/56 Goal status: INITIAL  3.  Pt will be able to complete a floor to standing transfer safely with min A.  Baseline: Unable  Goal status: INITIAL  PLAN:  PT FREQUENCY: 2x/week  PT DURATION: 8 weeks  PLANNED INTERVENTIONS: 97164- PT Re-evaluation, 97750- Physical Performance Testing, 97110-Therapeutic exercises, 97530- Therapeutic activity, 97112- Neuromuscular re-education, 97535- Self Care, 02859- Manual therapy, [phone]- Gait training, [phone]- Aquatic Therapy, [phone]- Electrical stimulation (manual), Patient/Family education, Balance training, Stair training, Joint mobilization, Spinal manipulation, Visual/preceptual remediation/compensation, DME instructions, Wheelchair mobility training, Cryotherapy, and Moist heat  PLAN FOR NEXT SESSION: static and dynamic balance, gait   DONAWERTH,KAREN, PT 01/21/2024, 8:43 AM

## 2024-01-20 ENCOUNTER — Encounter: Payer: Self-pay | Admitting: Radiology

## 2024-01-20 ENCOUNTER — Ambulatory Visit: Payer: Self-pay | Admitting: Urgent Care

## 2024-01-20 DIAGNOSIS — N390 Urinary tract infection, site not specified: Secondary | ICD-10-CM

## 2024-01-20 MED ORDER — FOSFOMYCIN TROMETHAMINE 3 G PO PACK: 3.0000 g | PACK | Freq: Once | ORAL | 0 refills | Status: AC

## 2024-01-21 ENCOUNTER — Other Ambulatory Visit (HOSPITAL_COMMUNITY): Payer: Self-pay

## 2024-01-21 ENCOUNTER — Encounter: Payer: Self-pay | Admitting: Physical Therapy

## 2024-01-22 ENCOUNTER — Encounter: Payer: Self-pay | Admitting: Physical Therapy

## 2024-01-22 ENCOUNTER — Telehealth: Payer: Self-pay

## 2024-01-22 ENCOUNTER — Other Ambulatory Visit (HOSPITAL_COMMUNITY): Payer: Self-pay

## 2024-01-22 ENCOUNTER — Ambulatory Visit: Payer: Self-pay | Attending: Urgent Care | Admitting: Physical Therapy

## 2024-01-22 DIAGNOSIS — R2689 Other abnormalities of gait and mobility: Secondary | ICD-10-CM | POA: Insufficient documentation

## 2024-01-22 DIAGNOSIS — R414 Neurologic neglect syndrome: Secondary | ICD-10-CM | POA: Diagnosis present

## 2024-01-22 DIAGNOSIS — R269 Unspecified abnormalities of gait and mobility: Secondary | ICD-10-CM | POA: Insufficient documentation

## 2024-01-22 LAB — URINE CULTURE

## 2024-01-22 NOTE — Telephone Encounter (Signed)
 Pharmacy Patient Advocate Encounter   Received notification from Patient Pharmacy that prior authorization for Fosfomycin 3gm packet is required/requested.   Insurance verification completed.   The patient is insured through [facility]   Per test claim: PA required; PA submitted to above mentioned insurance via Latent Key/confirmation #/EOC [id number] Status is pending

## 2024-01-22 NOTE — Therapy (Signed)
 OUTPATIENT PHYSICAL THERAPY LOWER EXTREMITY EVALUATION   Patient Name: Heather Luna MRN: [id number] DOB:[birth year], 74 y.o., female Today's Date: 01/22/2024  END OF SESSION:  PT End of Session - 01/22/24 1233     Visit Number 2    Number of Visits 17    Date for Recertification  03/13/24    Authorization Type [employer]    PT Start Time 1150    PT Stop Time 1230    PT Time Calculation (min) 40 min    Activity Tolerance Patient tolerated treatment well    Behavior During Therapy WFL for tasks assessed/performed           Past Medical History:  Diagnosis Date   Anxiety    Arthritis    Cataract    Chronic UTI    Depression    Diabetes mellitus without complication (HCC)    Seizures (HCC)    Stroke [facility])    Past Surgical History:  Procedure Laterality Date   ABDOMINAL HYSTERECTOMY     APPENDECTOMY     BREAST SURGERY     left breast   CHOLECYSTECTOMY     EYE SURGERY     left cataract   FRACTURE SURGERY     R foot   MASTECTOMY Left 2007   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) 01/02/2023   Hepatic cirrhosis (HCC) 01/02/2023   Recurrent falls 01/02/2023   History of seizure disorder 01/02/2023   Encephalomalacia with cerebral infarction (HCC) 01/02/2023   Recurrent UTI 09/24/2013   Absence of bladder continence 04/23/2012   Closed fracture of phalanx of foot 02/20/2012   Epilepsy (HCC) 07/03/2011   CVA (cerebral vascular accident) (HCC) 07/26/2005    PCP: Benton Gave, PA  REFERRING PROVIDER: Benton Gave, PA  REFERRING DIAG:    R29.6 (ICD-10-CM) - Recurrent falls  G93.89,I63.9 (ICD-10-CM) - Encephalomalacia with cerebral infarction (HCC)   Z86.69 (ICD-10-CM) - History of seizure disorder  R53.1 (ICD-10-CM) - Asthenia  I69.352 (ICD-10-CM) - 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 Her UTI is resolved and she is feeling better. 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 She states she was wearing socks, not wearing her right leg brace, did not have her cane, and it was in the dark for the majority of the 4x. Pt uses a hemiwalker, SPC, wheel chair, and left leg brace. Pt is able to ambulate with min assist when at home, however requires a cane, WC, or alternate assist for longer ambulatory lengths. Pt presents to initial eval with her daughter who lives 12 min away, consistently checking in on pt, and will be bringing her to therapy. Pt appears with several bruises on left side arm and leg, stating she doesn't know when she  runs into objects. She wants to improve her balance and get better with walking.  PAIN:  Are you having pain? No  PRECAUTIONS: Fall  RED FLAGS: None   WEIGHT BEARING RESTRICTIONS: No  FALLS:  Has patient fallen in last 6 months? Yes. 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  DIAGNOSTIC FINDINGS:    Cervical CT Scan IMPRESSION: 1. No acute intracranial abnormality. 2. No acute displaced fracture or traumatic listhesis of the cervical spine. 3. A 1.9 cm left parieto-occipital scalp hematoma. No retained radiopaque foreign body.    COGNITION: Overall cognitive status: History of cognitive impairments - at baseline     SENSATION: Proprioception: Impaired     POSTURE: weight shift right    LOWER EXTREMITY ROM:  Active ROM Right eval Left eval  Hip flexion    Hip extension    Hip abduction    Hip adduction    Hip internal rotation    Hip external rotation    Knee flexion    Knee extension    Ankle dorsiflexion    Ankle plantarflexion    Ankle inversion    Ankle eversion     (Blank rows = not tested)  LOWER EXTREMITY MMT:  MMT Right eval Left eval  Hip flexion 4 4-  Hip extension    Hip abduction    Hip adduction    Hip internal rotation 5 3+  Hip external rotation 5 3  Knee flexion 5 4  Knee extension 5 4-  Ankle dorsiflexion    Ankle plantarflexion    Ankle inversion    Ankle eversion     (Blank rows = not tested)    FUNCTIONAL TESTS:  Berg Balance Scale:  Item Test date: 01/17/24 Date:  Date:   Sitting to standing 3. able to stand independently using hands Insert SmartPhrase OPRCBERGREEVAL Insert SmartPhrase OPRCBERGREEVAL  2. Standing unsupported 3. able to stand 2 minutes with supervision    3. Sitting with back unsupported, feet supported 3. able to sit 2 minutes under supervision    4. Standing to sitting 1. sits independently but has uncontrolled descent    5. Pivot transfer  3. able to transfer safely with definite need of hands    6. Standing unsupported with eyes closed 3. able to stand 10 seconds with supervision    7. Standing unsupported with feet together 0. needs help to attain position and unable to hold for 15 seconds    8. Reaching  forward with outstretched arms while standing 2. can reach forward 5 cm (2 inches)    9. Pick up object from the floor from standing 3. able to pick up slipper but needs supervision    10. Turning to look behind over left and right shoulders while standing 2. turns sideways but only maintains balance    11. Turn 360 degrees 0. needs assistance while turning    12. Place alternate foot on step or stool while standing unsupported 1. able to complete > 2 steps needs minimal assist    13. Standing unsupported one foot in front 0. loses balance while stepping or standing  14. Standing on one leg 0. unable to try of needs assist to prevent fall      Total Score 24/56 Total Score:    Total Score:      GAIT: Distance walked: 75 ft Assistive device utilized: Single point cane Level of assistance: Complete Independence Comments: Pt depends on SPC to walk and tends to right when moving left side through gait cycle; decreased hip/knee flexion during gait cycle                                                                                                                                TREATMENT DATE:  [facility] Adult PT Treatment:                                                DATE: 01/22/24  Neuromuscular re-ed: Side stepping with focus on coordination and hip strengthening - requires mod manual facilitation Tap ups 4'' step with HHA/min A focus on wt shifts Standing with Rt LE on 4'' step to bias Lt LE with mod A for wt shifts/reaching task Standing tapping colored dots with focus on Lt LE stability - min/mod A  Gait: Gait training with SPC with obstacle negotiation - CGA, cues for LT attention Gait kicking airex foam pad - CGA- occasional min A for balance   [facility] Adult PT Treatment:                                                DATE: 01/17/24 Therapeutic Exercise: See HEP below HEP review      PATIENT EDUCATION:  Education details: Reviewed HEP and POC  Person educated: Patient and  Child(ren) Education method: Explanation, Demonstration, Tactile cues, Verbal cues, and Handouts Education comprehension: verbalized understanding, returned demonstration, verbal cues required, tactile cues required, and needs further education  HOME EXERCISE PROGRAM: Access Code: [email or web address] URL: [email or web address] Date: 01/17/2024 Prepared by: Darice Conine Exercises - Sit to Stand with Armchair  - 2 x daily - 7 x weekly - 3 sets - 5 reps   ASSESSMENT:  CLINICAL IMPRESSION: Session focused on balance and gait training, focus on Lt LE stability with manual facilitation for hip activation and to prevent Lt knee hyperextension during stance. Pt requires cuing for Lt attention during obstacle negotiation  OBJECTIVE IMPAIRMENTS: Abnormal gait, decreased activity tolerance, decreased balance, decreased cognition, decreased coordination, decreased endurance, decreased knowledge of condition, decreased knowledge of use of DME, decreased mobility, difficulty walking, decreased ROM, decreased strength, decreased safety awareness, increased fascial restrictions, impaired flexibility, impaired UE functional use, improper body mechanics, postural dysfunction, and obesity.      GOALS: Goals reviewed with patient? Yes  SHORT TERM GOALS: Target  date: 02/16/24 Pt will be efficient with initial HEP so she increases independence with everyday activities.  Baseline: Goal status: INITIAL  2.  Pt will be able to perform a sit to stand without UE assist so she increases independence with transfers.  Baseline:  Goal status: INITIAL  3.  Pt will reduce falls to <= 1 a week to demo improved safety and balance Baseline:  Goal status: INITIAL    LONG TERM GOALS: Target date: 03/13/24  Pt will be efficient with advanced HEP so she increases independence with everyday activities.  Baseline:  Goal status: INITIAL  2.  Pt will improve Berg balance scale by 10 points so she is a decreased  fall risk Baseline: 24/56 Goal status: INITIAL  3.  Pt will be able to complete a floor to standing transfer safely with min A.  Baseline: Unable  Goal status: INITIAL     PLAN:  PT FREQUENCY: 2x/week  PT DURATION: 8 weeks  PLANNED INTERVENTIONS: 97164- PT Re-evaluation, 97750- Physical Performance Testing, 97110-Therapeutic exercises, 97530- Therapeutic activity, 97112- Neuromuscular re-education, 97535- Self Care, 02859- Manual therapy, [phone]- Gait training, [phone]- Aquatic Therapy, [phone]- Electrical stimulation (manual), Patient/Family education, Balance training, Stair training, Joint mobilization, Spinal manipulation, Visual/preceptual remediation/compensation, DME instructions, Wheelchair mobility training, Cryotherapy, and Moist heat  PLAN FOR NEXT SESSION: static and dynamic balance, gait   Kahmari Herard, PT 01/22/2024, 12:35 PM

## 2024-01-23 ENCOUNTER — Other Ambulatory Visit (HOSPITAL_COMMUNITY): Payer: Self-pay

## 2024-01-23 ENCOUNTER — Telehealth: Payer: Self-pay

## 2024-01-23 NOTE — Telephone Encounter (Signed)
 Could you please call pt and notify her that her insurance refuses to pay for fosfomycin, but its only $13 at [facility] with goodrx. And its around $30 at other pharmacies with goodrx. If she would be willing to pay out of pocket using goodrx additional prior auth does not need to be completed. I can re-route to [facility] if she would like.

## 2024-01-23 NOTE — Telephone Encounter (Signed)
 Pharmacy Patient Advocate Encounter  Received notification from [facility] that Prior Authorization for Fosfomycin Tromethamine 3gm packet has been APPROVED from 01/22/24 to 01/21/25. Ran test claim, Copay is $63.10. This test claim was processed through [facility]- copay amounts may vary at other pharmacies due to pharmacy/plan contracts, or as the patient moves through the different stages of their insurance plan.   PA #/Case ID/Reference #: [id number]  Could you please send in a new order for the patient? Looks like it expired before we could get it approved for her.

## 2024-01-23 NOTE — Telephone Encounter (Signed)
 Copied from CRM [id number]. Topic: Clinical - Medication Prior Auth >> Jan 23, 2024  9:53 AM Anairis L wrote: Reason for CRM: Melia B from [facility] calling to update prior Auth for fosfomycin tromethamine 3g Rjdz#[id number] Valid 01/22/2024-01/21/2025 [phone] Option 5

## 2024-01-23 NOTE — Telephone Encounter (Signed)
 Attempted call to patient. Left a voice mail message requesting a return call.

## 2024-01-24 ENCOUNTER — Ambulatory Visit: Admitting: Rehabilitative and Restorative Service Providers"

## 2024-01-24 ENCOUNTER — Encounter: Payer: Self-pay | Admitting: Rehabilitative and Restorative Service Providers"

## 2024-01-24 DIAGNOSIS — R269 Unspecified abnormalities of gait and mobility: Secondary | ICD-10-CM | POA: Diagnosis not present

## 2024-01-24 DIAGNOSIS — R414 Neurologic neglect syndrome: Secondary | ICD-10-CM

## 2024-01-24 DIAGNOSIS — R2689 Other abnormalities of gait and mobility: Secondary | ICD-10-CM

## 2024-01-24 NOTE — Therapy (Signed)
 OUTPATIENT PHYSICAL THERAPY LOWER EXTREMITY TREATMENT   Patient Name: AAHANA ELZA MRN: [id number] DOB:Sep 27, 1950, 73 y.o., female Today's Date: 01/24/2024  END OF SESSION:  PT End of Session - 01/24/24 1104     Visit Number 3    Number of Visits 17    Date for Recertification  03/13/24    Authorization Type [employer] Blue [employer]    PT Start Time 1103    PT Stop Time 1145    PT Time Calculation (min) 42 min    Activity Tolerance Patient tolerated treatment well    Behavior During Therapy WFL for tasks assessed/performed            Past Medical History:  Diagnosis Date   Anxiety    Arthritis    Cataract    Chronic UTI    Depression    Diabetes mellitus without complication (HCC)    Seizures (HCC)    Stroke [facility])    Past Surgical History:  Procedure Laterality Date   ABDOMINAL HYSTERECTOMY     APPENDECTOMY     BREAST SURGERY     left breast   CHOLECYSTECTOMY     EYE SURGERY     left cataract   FRACTURE SURGERY     R foot   MASTECTOMY Left 2007   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) 01/02/2023   Hepatic cirrhosis (HCC) 01/02/2023   Recurrent falls 01/02/2023   History of seizure disorder 01/02/2023   Encephalomalacia with cerebral infarction (HCC) 01/02/2023   Recurrent UTI 09/24/2013   Absence of bladder continence 04/23/2012   Closed fracture of phalanx of foot 02/20/2012   Epilepsy (HCC) 07/03/2011   CVA (cerebral vascular accident) (HCC) 07/26/2005    PCP: Benton Gave, PA  REFERRING PROVIDER: Benton Gave, PA  REFERRING DIAG:    R29.6 (ICD-10-CM) - Recurrent falls  G93.89,I63.9 (ICD-10-CM) - Encephalomalacia with cerebral infarction (HCC)   Z86.69 (ICD-10-CM) - History of seizure disorder  R53.1 (ICD-10-CM) - Asthenia  I69.352 (ICD-10-CM) - 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 Her son in law brought her to her appointment. She enters with SPC.    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 She states she was wearing socks, not wearing her right leg brace, did not have her cane, and it was in the dark for the majority of the 4x. Pt uses a hemiwalker, SPC, wheel chair, and left leg brace. Pt is able to ambulate with min assist when at home, however requires a cane, WC, or alternate assist for longer ambulatory lengths. Pt presents to initial eval with her daughter who lives 12 min away, consistently checking in on pt, and will be bringing her to therapy. Pt appears with several bruises on left side arm and leg, stating she doesn't know  when she runs into objects. She wants to improve her balance and get better with walking.  PAIN:  Are you having pain? No  PRECAUTIONS: Fall  RED FLAGS: None   WEIGHT BEARING RESTRICTIONS: No  FALLS:  Has patient fallen in last 6 months? Yes. 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  DIAGNOSTIC FINDINGS:    Cervical CT Scan IMPRESSION: 1. No acute intracranial abnormality. 2. No acute displaced fracture or traumatic listhesis of the cervical spine. 3. A 1.9 cm left parieto-occipital scalp hematoma. No retained radiopaque foreign body.    COGNITION: Overall cognitive status: History of cognitive impairments - at baseline     SENSATION: Proprioception: Impaired     POSTURE: weight shift right    LOWER EXTREMITY ROM:  Active ROM Right eval Left eval  Hip flexion    Hip extension    Hip abduction    Hip adduction    Hip internal rotation    Hip external rotation    Knee flexion    Knee extension    Ankle dorsiflexion    Ankle plantarflexion    Ankle inversion    Ankle eversion     (Blank rows = not tested)  LOWER EXTREMITY MMT:  MMT Right eval Left eval  Hip flexion 4 4-  Hip extension    Hip abduction    Hip adduction    Hip internal rotation 5 3+  Hip external rotation 5 3  Knee flexion 5 4  Knee extension 5 4-  Ankle dorsiflexion    Ankle plantarflexion    Ankle inversion    Ankle eversion     (Blank rows = not tested)    FUNCTIONAL TESTS:  Berg Balance Scale:  Item Test date: 01/17/24 Date:  Date:   Sitting to standing 3. able to stand independently using hands Insert SmartPhrase OPRCBERGREEVAL Insert SmartPhrase OPRCBERGREEVAL  2. Standing unsupported 3. able to stand 2 minutes with supervision    3. Sitting with back unsupported, feet supported 3. able to sit 2 minutes under supervision    4. Standing to sitting 1. sits independently but has uncontrolled descent    5. Pivot transfer  3. able to transfer safely with definite need of hands    6. Standing unsupported with eyes closed 3. able to stand 10 seconds with supervision    7. Standing unsupported with feet together 0. needs help to attain position and unable to hold for 15 seconds    8. Reaching  forward with outstretched arms while standing 2. can reach forward 5 cm (2 inches)    9. Pick up object from the floor from standing 3. able to pick up slipper but needs supervision    10. Turning to look behind over left and right shoulders while standing 2. turns sideways but only maintains balance    11. Turn 360 degrees 0. needs assistance while turning    12. Place alternate foot on step or stool while standing unsupported 1. able to complete > 2 steps needs minimal assist    13. Standing unsupported one foot in front 0. loses balance while stepping  or standing    14. Standing on one leg 0. unable to try of needs assist to prevent fall      Total Score 24/56 Total Score:    Total Score:      GAIT: Distance walked: 75 ft Assistive device utilized: Single point cane Level of assistance: Complete Independence Comments: Pt depends on SPC to walk and tends to right when moving left side through gait cycle; decreased hip/knee flexion during gait cycle                                                                                                                                TREATMENT DATE:  [facility] Adult PT Treatment:                                                DATE: 01/24/24   Neuromuscular re-ed: Single limb balance-Weight bearing on left leg, right leg tapping blue dot in sagital plane x10, right leg tapping purple dot in frontal plane using mirror for cues  Left weight shifting placing right foot on 2 in step with min assist  Moving right foot anterior and posteriorly to work on left stance activities several times Therapeutic Activity: Ambulated 4x (down and back is1) along the left side 8 cones lined up on floor- instructed to not hit cones Ambulated in a figure 8 pattern 2x using 2 standing desks Sit to stands x10-emphasis on securing left foot in flat position before standing  Standing in stride position needing verbal and tactile cues for left gastroc stretch -required cues to  move left hip anteriorly    [facility] Adult PT Treatment:                                                DATE: 01/22/24  Neuromuscular re-ed: Side stepping with focus on coordination and hip strengthening - requires mod manual facilitation Tap ups 4'' step with HHA/min A focus on wt shifts Standing with Rt LE on 4'' step to bias Lt LE with mod A for wt shifts/reaching task Standing tapping colored dots with focus on Lt LE stability - min/mod A  Gait: Gait training with SPC with obstacle negotiation - CGA, cues for LT attention Gait kicking airex foam pad - CGA- occasional min A for balance   [facility] Adult PT Treatment:                                                DATE: 01/17/24 Therapeutic Exercise: See HEP below HEP review  PATIENT EDUCATION:  Education details: Reviewed HEP and POC  Person educated: Patient and Child(ren) Education method: Explanation, Demonstration, Tactile cues, Verbal cues, and Handouts Education comprehension: verbalized understanding, returned demonstration, verbal cues required, tactile cues required, and needs further education  HOME EXERCISE PROGRAM: Access Code: [email or web address] URL: [email or web address] Date: 01/17/2024 Prepared by: Darice Conine Exercises - Sit to Stand with Armchair  - 2 x daily - 7 x weekly - 3 sets - 5 reps   ASSESSMENT:  CLINICAL IMPRESSION:  Session focused on standing balance and gait training, targeting left LE stability with manual facilitation for hip activation and to prevent left knee recurvatum. Pt continues to present with absent left anterior tibial translation during gait, may request AFO modification alteration to ankle joint for greater dorsiflexion. Pt requires verbal cues for left attention during obstacle navigation. Pt met short term goal for sit to stands today.   OBJECTIVE IMPAIRMENTS: Abnormal gait, decreased activity tolerance, decreased balance, decreased cognition, decreased coordination, decreased  endurance, decreased knowledge of condition, decreased knowledge of use of DME, decreased mobility, difficulty walking, decreased ROM, decreased strength, decreased safety awareness, increased fascial restrictions, impaired flexibility, impaired UE functional use, improper body mechanics, postural dysfunction, and obesity.      GOALS: Goals reviewed with patient? Yes  SHORT TERM GOALS: Target date: 02/16/24 Pt will be efficient with initial HEP so she increases independence with everyday activities.  Baseline: Goal status: Progressing   2.  Pt will be able to perform a sit to stand without UE assist so she increases independence with transfers.  Baseline:  Goal status: MET on 01/24/24  3.  Pt will reduce falls to <= 1 a week to demo improved safety and balance Baseline:  Goal status: INITIAL    LONG TERM GOALS: Target date: 03/13/24  Pt will be efficient with advanced HEP so she increases independence with everyday activities.  Baseline:  Goal status: INITIAL  2.  Pt will improve Berg balance scale by 10 points so she is a decreased fall risk Baseline: 24/56 Goal status: INITIAL  3.  Pt will be able to complete a floor to standing transfer safely with min A.  Baseline: Unable  Goal status: INITIAL     PLAN:  PT FREQUENCY: 2x/week  PT DURATION: 8 weeks  PLANNED INTERVENTIONS: 97164- PT Re-evaluation, 97750- Physical Performance Testing, 97110-Therapeutic exercises, 97530- Therapeutic activity, 97112- Neuromuscular re-education, 97535- Self Care, 02859- Manual therapy, [phone]- Gait training, [phone]- Aquatic Therapy, [phone]- Electrical stimulation (manual), Patient/Family education, Balance training, Stair training, Joint mobilization, Spinal manipulation, Visual/preceptual remediation/compensation, DME instructions, Wheelchair mobility training, Cryotherapy, and Moist heat  PLAN FOR NEXT SESSION: static and dynamic balance, gait, observe ankle mobility without AFO,     Lavanda Cleverly, Student-PT 01/24/2024, 11:54 AM

## 2024-01-28 ENCOUNTER — Ambulatory Visit: Payer: Self-pay | Admitting: Rehabilitative and Restorative Service Providers"

## 2024-01-28 NOTE — Therapy (Deleted)
 OUTPATIENT PHYSICAL THERAPY LOWER EXTREMITY TREATMENT   Patient Name: Heather Luna MRN: [id number] DOB:[birth year], 73 y.o., female Today's Date: 01/28/2024  END OF SESSION:      Past Medical History:  Diagnosis Date   Anxiety    Arthritis    Cataract    Chronic UTI    Depression    Diabetes mellitus without complication (HCC)    Seizures (HCC)    Stroke [facility])    Past Surgical History:  Procedure Laterality Date   ABDOMINAL HYSTERECTOMY     APPENDECTOMY     BREAST SURGERY     left breast   CHOLECYSTECTOMY     EYE SURGERY     left cataract   FRACTURE SURGERY     R foot   MASTECTOMY Left 2007   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) 01/02/2023   Hepatic cirrhosis (HCC) 01/02/2023   Recurrent falls 01/02/2023   History of seizure disorder 01/02/2023   Encephalomalacia with cerebral infarction (HCC) 01/02/2023   Recurrent UTI 09/24/2013   Absence of bladder continence 04/23/2012   Closed fracture of phalanx of foot 02/20/2012   Epilepsy (HCC) 07/03/2011   CVA (cerebral vascular accident) (HCC) 07/26/2005    PCP: Benton Gave, PA  REFERRING PROVIDER: Benton Gave, PA  REFERRING DIAG:    R29.6 (ICD-10-CM) - Recurrent falls  G93.89,I63.9 (ICD-10-CM) - Encephalomalacia with cerebral infarction (HCC)  Z86.69 (ICD-10-CM) - History of seizure disorder  R53.1 (ICD-10-CM) - Asthenia  I69.352 (ICD-10-CM) - 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  Rationale for Evaluation and Treatment: Rehabilitation  ONSET DATE:  Chronic  SUBJECTIVE:   SUBJECTIVE STATEMENT: Pt reports no UTI, no falls. Her son in law brought her to her appointment. She enters with SPC.    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 She states she was wearing socks, not wearing her right leg brace, did not have her cane, and it was in the dark for the majority of the 4x. Pt uses a hemiwalker, SPC, wheel chair, and left leg brace. Pt is able to ambulate with min assist when at home, however requires a cane, WC, or alternate assist for longer ambulatory lengths. Pt presents to initial eval with her daughter who lives 12 min away, consistently checking in on pt, and will be bringing her to therapy. Pt appears with several bruises on left side arm and leg, stating she doesn't know when she runs into objects. She wants to improve her balance and get better with walking.  PAIN:  Are you having pain? No  PRECAUTIONS: Fall  RED FLAGS: None   WEIGHT BEARING RESTRICTIONS: No  FALLS:  Has patient fallen in last 6 months? Yes. 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  DIAGNOSTIC FINDINGS:    Cervical CT Scan IMPRESSION: 1. No acute intracranial abnormality. 2. No acute displaced fracture or traumatic listhesis of the cervical spine. 3. A 1.9 cm left parieto-occipital scalp hematoma. No retained radiopaque foreign body.    COGNITION: Overall cognitive status: History of cognitive impairments  - at baseline     SENSATION: Proprioception: Impaired     POSTURE: weight shift right    LOWER EXTREMITY ROM:  Active ROM Right eval Left eval  Hip flexion    Hip extension    Hip abduction    Hip adduction    Hip internal rotation    Hip external rotation    Knee flexion    Knee extension    Ankle dorsiflexion    Ankle plantarflexion    Ankle inversion    Ankle eversion     (Blank rows = not tested)  LOWER EXTREMITY MMT:  MMT Right eval Left eval  Hip flexion 4 4-  Hip extension    Hip abduction    Hip adduction    Hip internal rotation 5 3+  Hip external rotation 5 3  Knee flexion 5 4  Knee extension 5 4-  Ankle dorsiflexion    Ankle plantarflexion    Ankle inversion    Ankle eversion     (Blank rows = not tested)    FUNCTIONAL TESTS:  Berg Balance Scale:  Item Test date: 01/17/24 Date:  Date:   Sitting to standing 3. able to stand independently using hands Insert SmartPhrase OPRCBERGREEVAL Insert SmartPhrase OPRCBERGREEVAL  2. Standing unsupported 3. able to stand 2 minutes with supervision    3. Sitting with back unsupported, feet supported 3. able to sit 2 minutes under supervision    4. Standing to sitting 1. sits independently but has uncontrolled descent    5. Pivot transfer  3. able to transfer safely with definite need of hands    6. Standing unsupported with eyes closed 3. able to stand 10 seconds with supervision    7. Standing unsupported with feet together 0. needs help to attain position and unable to hold for 15 seconds    8. Reaching forward with outstretched arms while standing 2. can reach forward 5 cm (2 inches)    9. Pick up object from the floor from standing 3. able to pick up slipper but needs supervision    10. Turning to look behind over left and right shoulders while standing 2. turns sideways but only maintains balance    11. Turn 360 degrees 0. needs assistance while turning    12. Place alternate foot on step or stool  while standing unsupported 1. able to complete > 2 steps needs minimal assist    13. Standing unsupported one foot in front 0. loses balance while stepping or standing    14. Standing on one leg 0. unable to try of needs assist to prevent fall      Total Score 24/56 Total Score:    Total Score:      GAIT: Distance walked: 75 ft Assistive device utilized: Single point cane Level of assistance: Complete Independence Comments: Pt depends on SPC to walk and tends to right when moving left side through gait cycle; decreased hip/knee flexion during gait cycle  TREATMENT DATE:  [facility] Adult PT Treatment:                                                DATE: 01/24/24   Neuromuscular re-ed: Single limb balance-Weight bearing on left leg, right leg tapping blue dot in sagital plane x10, right leg tapping purple dot in frontal plane using mirror for cues  Left weight shifting placing right foot on 2 in step with min assist  Moving right foot anterior and posteriorly to work on left stance activities several times Therapeutic Activity: Ambulated 4x (down and back is1) along the left side 8 cones lined up on floor- instructed to not hit cones Ambulated in a figure 8 pattern 2x using 2 standing desks Sit to stands x10-emphasis on securing left foot in flat position before standing  Standing in stride position needing verbal and tactile cues for left gastroc stretch -required cues to move left hip anteriorly    [facility] Adult PT Treatment:                                                DATE: 01/22/24  Neuromuscular re-ed: Side stepping with focus on coordination and hip strengthening - requires mod manual facilitation Tap ups 4'' step with HHA/min A focus on wt shifts Standing with Rt LE on 4'' step to bias Lt LE with mod A for wt shifts/reaching task Standing tapping colored dots  with focus on Lt LE stability - min/mod A  Gait: Gait training with SPC with obstacle negotiation - CGA, cues for LT attention Gait kicking airex foam pad - CGA- occasional min A for balance   [facility] Adult PT Treatment:                                                DATE: 01/17/24 Therapeutic Exercise: See HEP below HEP review      PATIENT EDUCATION:  Education details: Reviewed HEP and POC  Person educated: Patient and Child(ren) Education method: Explanation, Demonstration, Tactile cues, Verbal cues, and Handouts Education comprehension: verbalized understanding, returned demonstration, verbal cues required, tactile cues required, and needs further education  HOME EXERCISE PROGRAM: Access Code: [email or web address] URL: [email or web address] Date: 01/17/2024 Prepared by: Darice Conine Exercises - Sit to Stand with Armchair  - 2 x daily - 7 x weekly - 3 sets - 5 reps   ASSESSMENT:  CLINICAL IMPRESSION:  Session focused on standing balance and gait training, targeting left LE stability with manual facilitation for hip activation and to prevent left knee recurvatum. Pt continues to present with absent left anterior tibial translation during gait, may request AFO modification alteration to ankle joint for greater dorsiflexion. Pt requires verbal cues for left attention during obstacle navigation. Pt met short term goal for sit to stands today.   OBJECTIVE IMPAIRMENTS: Abnormal gait, decreased activity tolerance, decreased balance, decreased cognition, decreased coordination, decreased endurance, decreased knowledge of condition, decreased knowledge of use of DME, decreased mobility, difficulty walking, decreased ROM, decreased strength, decreased safety awareness, increased fascial restrictions, impaired flexibility, impaired UE functional use, improper body mechanics, postural dysfunction, and  obesity.      GOALS: Goals reviewed with patient? Yes  SHORT TERM GOALS: Target date:  02/16/24 Pt will be efficient with initial HEP so she increases independence with everyday activities.  Baseline: Goal status: Progressing   2.  Pt will be able to perform a sit to stand without UE assist so she increases independence with transfers.  Baseline:  Goal status: MET on 01/24/24  3.  Pt will reduce falls to <= 1 a week to demo improved safety and balance Baseline:  Goal status: INITIAL    LONG TERM GOALS: Target date: 03/13/24  Pt will be efficient with advanced HEP so she increases independence with everyday activities.  Baseline:  Goal status: INITIAL  2.  Pt will improve Berg balance scale by 10 points so she is a decreased fall risk Baseline: 24/56 Goal status: INITIAL  3.  Pt will be able to complete a floor to standing transfer safely with min A.  Baseline: Unable  Goal status: INITIAL     PLAN:  PT FREQUENCY: 2x/week  PT DURATION: 8 weeks  PLANNED INTERVENTIONS: 97164- PT Re-evaluation, 97750- Physical Performance Testing, 97110-Therapeutic exercises, 97530- Therapeutic activity, 97112- Neuromuscular re-education, 97535- Self Care, 02859- Manual therapy, [phone]- Gait training, [phone]- Aquatic Therapy, [phone]- Electrical stimulation (manual), Patient/Family education, Balance training, Stair training, Joint mobilization, Spinal manipulation, Visual/preceptual remediation/compensation, DME instructions, Wheelchair mobility training, Cryotherapy, and Moist heat  PLAN FOR NEXT SESSION: static and dynamic balance, gait, observe ankle mobility without AFO,    Lindee Leason, PT 01/28/2024, 8:02 AM

## 2024-01-29 NOTE — Telephone Encounter (Signed)
 Patient sent message that med was approved on 01/23/2024

## 2024-01-31 ENCOUNTER — Encounter: Payer: Self-pay | Admitting: Physical Therapy

## 2024-01-31 ENCOUNTER — Ambulatory Visit: Admitting: Physical Therapy

## 2024-01-31 DIAGNOSIS — R414 Neurologic neglect syndrome: Secondary | ICD-10-CM

## 2024-01-31 DIAGNOSIS — R269 Unspecified abnormalities of gait and mobility: Secondary | ICD-10-CM | POA: Diagnosis not present

## 2024-01-31 NOTE — Therapy (Addendum)
 OUTPATIENT PHYSICAL THERAPY LOWER EXTREMITY TREATMENT   Patient Name: Heather Luna MRN: [id number] DOB:05/26/1950, 73 y.o., female Today's Date: 01/31/2024  END OF SESSION:  PT End of Session - 01/31/24 1105     Visit Number 4    Number of Visits 17    Date for Recertification  03/13/24    Authorization Type [employer]    PT Start Time 1105    PT Stop Time 1145    PT Time Calculation (min) 40 min    Activity Tolerance Patient tolerated treatment well    Behavior During Therapy WFL for tasks assessed/performed             Past Medical History:  Diagnosis Date   Anxiety    Arthritis    Cataract    Chronic UTI    Depression    Diabetes mellitus without complication (HCC)    Seizures (HCC)    Stroke [facility])    Past Surgical History:  Procedure Laterality Date   ABDOMINAL HYSTERECTOMY     APPENDECTOMY     BREAST SURGERY     left breast   CHOLECYSTECTOMY     EYE SURGERY     left cataract   FRACTURE SURGERY     R foot   MASTECTOMY Left 2007   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) 01/02/2023   Hepatic cirrhosis (HCC) 01/02/2023   Recurrent falls 01/02/2023   History of seizure disorder 01/02/2023   Encephalomalacia with cerebral infarction (HCC) 01/02/2023   Recurrent UTI 09/24/2013   Absence of bladder continence 04/23/2012   Closed fracture of phalanx of foot 02/20/2012   Epilepsy (HCC) 07/03/2011   CVA (cerebral vascular accident) (HCC) 07/26/2005    PCP: Benton Gave, PA  REFERRING PROVIDER: Benton Gave, PA  REFERRING DIAG:    R29.6 (ICD-10-CM) - Recurrent falls  G93.89,I63.9 (ICD-10-CM) - Encephalomalacia with cerebral infarction (HCC)   Z86.69 (ICD-10-CM) - History of seizure disorder  R53.1 (ICD-10-CM) - Asthenia  I69.352 (ICD-10-CM) - 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 States pt has bruising on her trunk and parts of her legs. She states falling off the toliet, I chose to fall she said instead of having the back of commode fall.     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 She states she was wearing socks, not wearing her right leg brace, did not have her cane, and it was in the dark for the majority of the 4x. Pt uses a hemiwalker, SPC, wheel chair, and left leg brace. Pt is able to ambulate with min assist when at home, however requires a cane, WC, or alternate assist for longer ambulatory lengths. Pt presents to initial eval with her daughter who lives  12 min away, consistently checking in on pt, and will be bringing her to therapy. Pt appears with several bruises on left side arm and leg, stating she doesn't know when she runs into objects. She wants to improve her balance and get better with walking.  PAIN:  Are you having pain? No  PRECAUTIONS: Fall  RED FLAGS: None   WEIGHT BEARING RESTRICTIONS: No  FALLS:  Has patient fallen in last 6 months? Yes. 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  DIAGNOSTIC FINDINGS:    Cervical CT Scan IMPRESSION: 1. No acute intracranial abnormality. 2. No acute displaced fracture or traumatic listhesis of the cervical spine. 3. A 1.9 cm left parieto-occipital scalp hematoma. No retained radiopaque foreign body.    COGNITION: Overall cognitive status: History of cognitive impairments - at baseline     SENSATION: Proprioception: Impaired     POSTURE: weight shift right    LOWER EXTREMITY ROM:  Active ROM Right eval Left eval  Hip flexion    Hip extension    Hip abduction    Hip adduction    Hip internal rotation    Hip external rotation    Knee flexion    Knee extension    Ankle dorsiflexion    Ankle plantarflexion    Ankle inversion    Ankle eversion     (Blank rows = not tested)  LOWER EXTREMITY MMT:  MMT Right eval Left eval  Hip flexion 4 4-  Hip extension    Hip abduction    Hip adduction    Hip internal rotation 5 3+  Hip external rotation 5 3  Knee flexion 5 4  Knee extension 5 4-  Ankle dorsiflexion    Ankle plantarflexion    Ankle inversion    Ankle eversion     (Blank rows = not tested)    FUNCTIONAL TESTS:  Berg Balance Scale:  Item Test date: 01/17/24 Date:  Date:   Sitting to standing 3. able to stand independently using hands Insert SmartPhrase OPRCBERGREEVAL Insert SmartPhrase OPRCBERGREEVAL  2. Standing unsupported 3. able to stand 2 minutes with supervision    3. Sitting with back unsupported, feet supported 3. able to sit 2 minutes under supervision    4. Standing to sitting 1. sits independently but has uncontrolled descent    5. Pivot transfer  3. able to transfer safely with definite need of hands    6. Standing unsupported with eyes closed 3. able to stand 10 seconds with  supervision    7. Standing unsupported with feet together 0. needs help to attain position and unable to hold for 15 seconds    8. Reaching forward with outstretched arms while standing 2. can reach forward 5 cm (2 inches)    9. Pick up object from the floor from standing 3. able to pick up slipper but needs supervision    10. Turning to look behind over left and right shoulders while standing 2. turns sideways but only maintains balance    11. Turn 360 degrees 0. needs assistance while turning    12. Place alternate foot on step  or stool while standing unsupported 1. able to complete > 2 steps needs minimal assist    13. Standing unsupported one foot in front 0. loses balance while stepping or standing    14. Standing on one leg 0. unable to try of needs assist to prevent fall      Total Score 24/56 Total Score:    Total Score:      GAIT: Distance walked: 75 ft Assistive device utilized: Single point cane Level of assistance: Complete Independence Comments: Pt depends on SPC to walk and tends to right when moving left side through gait cycle; decreased hip/knee flexion during gait cycle                                                                                                                                TREATMENT DATE:   [facility] Adult PT Treatment:                                                DATE: 01/31/24 Therapeutic Exercise: Assessed pt ankle AFO for ROM potential- pt demonstrated decreased ankle DF AROM; able to move passively  Neuromuscular re-ed: Single limb balance-Weight bearing on left leg, right leg tapping yellow dot in frontal plane x10, left leg tapping yellow dot in frontal plane using mirror for cues Therapeutic Activity: Ambulated 3x (down and back is1) along the left side 6 cones lined up on floor- instructed not to hit cones - close supervision/CGA Ambulated alongside 2 cones placed without telling pt, giving no instruction, pt demonstrated good carryover from  last session and scanned scene - close supervision/CGA Ambulated in a figure 8 pattern 1x using 2 standing desks close supervision/CGA Sit to stands- cueing step 1-feel table/chair on back of both legs Step 2- slowly squat. Then sit down x12   [facility] Adult PT Treatment:                                                DATE: 01/24/24   Neuromuscular re-ed: Single limb balance-Weight bearing on left leg, right leg tapping blue dot in sagital plane x10, right leg tapping purple dot in frontal plane using mirror for cues  Left weight shifting placing right foot on 2 in step with min assist  Moving right foot anterior and posteriorly to work on left stance activities several times Therapeutic Activity: Ambulated 4x (down and back is1) along the left side 8 cones lined up on floor- instructed to not hit cones Ambulated in a figure 8 pattern 2x using 2 standing desks Sit to stands x10-emphasis on securing left foot in flat position before standing  Standing in stride position needing verbal and tactile  cues for left gastroc stretch -required cues to move left hip anteriorly    [facility] Adult PT Treatment:                                                DATE: 01/22/24  Neuromuscular re-ed: Side stepping with focus on coordination and hip strengthening - requires mod manual facilitation Tap ups 4'' step with HHA/min A focus on wt shifts Standing with Rt LE on 4'' step to bias Lt LE with mod A for wt shifts/reaching task Standing tapping colored dots with focus on Lt LE stability - min/mod A  Gait: Gait training with SPC with obstacle negotiation - CGA, cues for LT attention Gait kicking airex foam pad - CGA- occasional min A for balance   [facility] Adult PT Treatment:                                                DATE: 01/17/24 Therapeutic Exercise: See HEP below HEP review      PATIENT EDUCATION:  Education details: Reviewed HEP and POC  Person educated: Patient and Child(ren) Education method:  Explanation, Demonstration, Tactile cues, Verbal cues, and Handouts Education comprehension: verbalized understanding, returned demonstration, verbal cues required, tactile cues required, and needs further education  HOME EXERCISE PROGRAM: Access Code: [email or web address] URL: [email or web address] Date: 01/17/2024 Prepared by: Darice Conine Exercises - Sit to Stand with Armchair  - 2 x daily - 7 x weekly - 3 sets - 5 reps   ASSESSMENT:  CLINICAL IMPRESSION:   Demonstrated good carryover from last session, able to bypass obstacles purposefully placed on the floor by scanning the scene before ambulating, requiring no cues. Pt continues to present with absent left anterior tibial translation during gait. Checked pt's ankle DF AROM, is significantly decreased. Pt required less verbal cues for left attention during obstacle navigation. Improvement noted in eccentric control during sit to stands. Required tactile cue at pt's left ASIS turn your hip into my hand during gait to correct hip alignment. Will continue to progress as able.   OBJECTIVE IMPAIRMENTS: Abnormal gait, decreased activity tolerance, decreased balance, decreased cognition, decreased coordination, decreased endurance, decreased knowledge of condition, decreased knowledge of use of DME, decreased mobility, difficulty walking, decreased ROM, decreased strength, decreased safety awareness, increased fascial restrictions, impaired flexibility, impaired UE functional use, improper body mechanics, postural dysfunction, and obesity.      GOALS: Goals reviewed with patient? Yes  SHORT TERM GOALS: Target date: 02/16/24 Pt will be efficient with initial HEP so she increases independence with everyday activities.  Baseline: Goal status: Progressing   2.  Pt will be able to perform a sit to stand without UE assist so she increases independence with transfers.  Baseline:  Goal status: MET on 01/24/24  3.  Pt will reduce falls to <=  1 a week to demo improved safety and balance Baseline:  Goal status: Progressing (only 1 fall past week 01/31/24)    LONG TERM GOALS: Target date: 03/13/24  Pt will be efficient with advanced HEP so she increases independence with everyday activities.  Baseline:  Goal status: INITIAL  2.  Pt will improve Berg balance scale by 10 points so she is a decreased fall risk Baseline: 24/56  Goal status: INITIAL  3.  Pt will be able to complete a floor to standing transfer safely with min A.  Baseline: Unable  Goal status: INITIAL     PLAN:  PT FREQUENCY: 2x/week  PT DURATION: 8 weeks  PLANNED INTERVENTIONS: 97164- PT Re-evaluation, 97750- Physical Performance Testing, 97110-Therapeutic exercises, 97530- Therapeutic activity, 97112- Neuromuscular re-education, [phone]- Self Care, [id number]- Manual therapy, [phone]- Gait training, [phone]- Aquatic Therapy, [phone]- Electrical stimulation (manual), Patient/Family education, Balance training, Stair training, Joint mobilization, Spinal manipulation, Visual/preceptual remediation/compensation, DME instructions, Wheelchair mobility training, Cryotherapy, and Moist heat  PLAN FOR NEXT SESSION: static and dynamic balance, gait, continue sit to stand practice, exercises requiring use of her left side, continue using tactile cue at pt's left ASIS for hip alignment during gait   Lavanda Cleverly, Student-PT 01/31/2024, 12:30 PM

## 2024-02-04 ENCOUNTER — Ambulatory Visit: Payer: Self-pay | Admitting: Physical Therapy

## 2024-02-04 ENCOUNTER — Encounter: Payer: Self-pay | Admitting: Physical Therapy

## 2024-02-04 DIAGNOSIS — R414 Neurologic neglect syndrome: Secondary | ICD-10-CM

## 2024-02-04 NOTE — Therapy (Signed)
 OUTPATIENT PHYSICAL THERAPY LOWER EXTREMITY TREATMENT   Patient Name: Heather Luna MRN: [id number] DOB:Apr 27, 1950, 73 y.o., female Today's Date: 02/04/2024  END OF SESSION:  PT End of Session - 02/04/24 0854     Visit Number 5    Number of Visits 17    Date for Recertification  03/13/24    Authorization Type [employer]    PT Start Time [phone]   Pt arrived late   PT Stop Time 0930    PT Time Calculation (min) 36 min    Activity Tolerance Patient tolerated treatment well    Behavior During Therapy WFL for tasks assessed/performed              Past Medical History:  Diagnosis Date   Anxiety    Arthritis    Cataract    Chronic UTI    Depression    Diabetes mellitus without complication (HCC)    Seizures (HCC)    Stroke [facility])    Past Surgical History:  Procedure Laterality Date   ABDOMINAL HYSTERECTOMY     APPENDECTOMY     BREAST SURGERY     left breast   CHOLECYSTECTOMY     EYE SURGERY     left cataract   FRACTURE SURGERY     R foot   MASTECTOMY Left 2007   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) 01/02/2023   Hepatic cirrhosis (HCC) 01/02/2023   Recurrent falls 01/02/2023   History of seizure disorder 01/02/2023   Encephalomalacia with cerebral infarction (HCC) 01/02/2023   Recurrent UTI 09/24/2013   Absence of bladder continence 04/23/2012   Closed fracture of phalanx of foot 02/20/2012   Epilepsy (HCC) 07/03/2011   CVA (cerebral vascular accident) (HCC) 07/26/2005    PCP: Benton Gave, PA  REFERRING PROVIDER: Benton Gave, PA  REFERRING DIAG:    R29.6 (ICD-10-CM) - Recurrent falls  G93.89,I63.9 (ICD-10-CM) - Encephalomalacia with cerebral  infarction (HCC)  Z86.69 (ICD-10-CM) - History of seizure disorder  R53.1 (ICD-10-CM) - Asthenia  I69.352 (ICD-10-CM) - 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 She states she is having muscle spasms in left leg this past week. Getting up and walking helps reduce the spasms.    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 She states she was wearing socks, not wearing her right leg brace, did not have her cane, and it was in the dark for the majority of the 4x. Pt uses a hemiwalker, SPC, wheel chair, and left leg brace. Pt is able to ambulate with min assist when at home, however requires a cane, WC, or alternate assist for longer ambulatory lengths. Pt presents to initial eval with her daughter who lives 12 min away, consistently checking in on pt, and will be bringing her to therapy. Pt appears with several bruises on  left side arm and leg, stating she doesn't know when she runs into objects. She wants to improve her balance and get better with walking.  PAIN:  Are you having pain? No  PRECAUTIONS: Fall  RED FLAGS: None   WEIGHT BEARING RESTRICTIONS: No  FALLS:  Has patient fallen in last 6 months? Yes. 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  DIAGNOSTIC FINDINGS:    Cervical CT Scan IMPRESSION: 1. No acute intracranial abnormality. 2. No acute displaced fracture or traumatic listhesis of the cervical spine. 3. A 1.9 cm left parieto-occipital scalp hematoma. No retained radiopaque foreign body.    COGNITION: Overall cognitive status: History of cognitive impairments - at baseline     SENSATION: Proprioception: Impaired     POSTURE: weight shift right    LOWER EXTREMITY ROM:  Active ROM Right eval Left eval  Hip flexion    Hip extension    Hip abduction    Hip adduction    Hip internal rotation    Hip external rotation    Knee flexion    Knee extension    Ankle dorsiflexion    Ankle plantarflexion    Ankle inversion    Ankle eversion     (Blank rows = not tested)  LOWER EXTREMITY MMT:  MMT Right eval Left eval  Hip flexion 4 4-  Hip extension    Hip abduction    Hip adduction    Hip internal rotation 5 3+  Hip external rotation 5 3  Knee flexion 5 4  Knee extension 5 4-  Ankle dorsiflexion    Ankle plantarflexion    Ankle inversion    Ankle eversion     (Blank rows = not tested)    FUNCTIONAL TESTS:  Berg Balance Scale:  Item Test date: 01/17/24 Date:  Date:   Sitting to standing 3. able to stand independently using hands Insert SmartPhrase OPRCBERGREEVAL Insert SmartPhrase OPRCBERGREEVAL  2. Standing unsupported 3. able to stand 2 minutes with supervision    3. Sitting with back unsupported, feet supported 3. able to sit 2 minutes under supervision    4. Standing to sitting 1. sits independently but has uncontrolled descent    5. Pivot transfer  3. able to transfer safely with definite need of hands    6. Standing unsupported with eyes closed 3. able to stand 10 seconds with supervision    7. Standing unsupported with feet together 0. needs help to attain position and unable to hold  for 15 seconds    8. Reaching forward with outstretched arms while standing 2. can reach forward 5 cm (2 inches)    9. Pick up object from the floor from standing 3. able to pick up slipper but needs supervision    10. Turning to look behind over left and right shoulders while standing 2. turns sideways but only maintains balance    11. Turn 360 degrees 0. needs assistance while turning    12. Place alternate foot on step or stool while standing unsupported 1. able to complete > 2 steps needs minimal assist    13. Standing unsupported  one foot in front 0. loses balance while stepping or standing    14. Standing on one leg 0. unable to try of needs assist to prevent fall      Total Score 24/56 Total Score:    Total Score:      GAIT: Distance walked: 75 ft Assistive device utilized: Single point cane Level of assistance: Complete Independence Comments: Pt depends on SPC to walk and tends to right when moving left side through gait cycle; decreased hip/knee flexion during gait cycle                                                                                                                                TREATMENT DATE:  [facility] Adult PT Treatment:                                                DATE: 02/04/24   Therapeutic Activity: Floor to stand transfers using floor mat: performed PNF agonist reversal to posterior bilateral hips for upright posture from prone, min to mod assist for overall transfer Supine to sitting transfers- trialed various patterns for rolling, using both sides Sit to stands x5 + 4 steps forward to lose point of reference -verbal cues to touch back of leg to table then squat down slowly    [facility] Adult PT Treatment:                                                DATE: 01/31/24 Therapeutic Exercise: Assessed pt ankle AFO for ROM potential- pt demonstrated decreased ankle DF AROM; able to move passively  Neuromuscular re-ed: Single limb balance-Weight bearing on left  leg, right leg tapping yellow dot in frontal plane x10, left leg tapping yellow dot in frontal plane using mirror for cues Therapeutic Activity: Ambulated 3x (down and back is1) along the left side 6 cones lined up on floor- instructed not to hit cones Ambulated alongside 2 cones placed without telling pt, giving no instruction, pt demonstrated good carryover from last session and scanned scene  Ambulated in a figure 8 pattern 1x using 2 standing desks Sit to stands- cueing step 1-feel table/chair on back of both legs Step 2- slowly squat. Then sit down x12   [facility] Adult PT Treatment:                                                DATE: 01/24/24   Neuromuscular re-ed: Single limb balance-Weight bearing on left leg, right leg tapping blue dot in sagital plane x10, right  leg tapping purple dot in frontal plane using mirror for cues  Left weight shifting placing right foot on 2 in step with min assist  Moving right foot anterior and posteriorly to work on left stance activities several times Therapeutic Activity: Ambulated 4x (down and back is1) along the left side 8 cones lined up on floor- instructed to not hit cones Ambulated in a figure 8 pattern 2x using 2 standing desks Sit to stands x10-emphasis on securing left foot in flat position before standing  Standing in stride position needing verbal and tactile cues for left gastroc stretch -required cues to move left hip anteriorly    [facility] Adult PT Treatment:                                                DATE: 01/22/24  Neuromuscular re-ed: Side stepping with focus on coordination and hip strengthening - requires mod manual facilitation Tap ups 4'' step with HHA/min A focus on wt shifts Standing with Rt LE on 4'' step to bias Lt LE with mod A for wt shifts/reaching task Standing tapping colored dots with focus on Lt LE stability - min/mod A  Gait: Gait training with SPC with obstacle negotiation - CGA, cues for LT attention Gait kicking  airex foam pad - CGA- occasional min A for balance   [facility] Adult PT Treatment:                                                DATE: 01/17/24 Therapeutic Exercise: See HEP below HEP review      PATIENT EDUCATION:  Education details: Reviewed HEP and POC  Person educated: Patient and Child(ren) Education method: Explanation, Demonstration, Tactile cues, Verbal cues, and Handouts Education comprehension: verbalized understanding, returned demonstration, verbal cues required, tactile cues required, and needs further education  HOME EXERCISE PROGRAM: Access Code: [email or web address] URL: [email or web address] Date: 01/17/2024 Prepared by: Darice Conine Exercises - Sit to Stand with Armchair  - 2 x daily - 7 x weekly - 3 sets - 5 reps   ASSESSMENT:  CLINICAL IMPRESSION: Good carryover from last session regarding sit to stands. Pt aligned posterior right leg to table, for sensory information, next slowly squatted down, and sitting safely. Still needs verbal cues to perform intermittently throughout session today, usually when she would be talking about a different topic, less concentration on movement. Floor to standing transfer, pt responded well to tactile cues to hips for hip muscle activation, however struggled to bend both knees for a kneeling position. Trialed leaning right side onto table and pushing up, however pt required min- mod assist with transfer overall, requiring several rest breaks throughout. Pt able to complete 1 floor to standing transfer by end of session, requiring min-mod assist. Will continue to progress as able.     OBJECTIVE IMPAIRMENTS: Abnormal gait, decreased activity tolerance, decreased balance, decreased cognition, decreased coordination, decreased endurance, decreased knowledge of condition, decreased knowledge of use of DME, decreased mobility, difficulty walking, decreased ROM, decreased strength, decreased safety awareness, increased fascial restrictions,  impaired flexibility, impaired UE functional use, improper body mechanics, postural dysfunction, and obesity.      GOALS: Goals reviewed with patient? Yes  SHORT TERM GOALS: Target date: 02/16/24 Pt will  be efficient with initial HEP so she increases independence with everyday activities.  Baseline: Goal status: Progressing   2.  Pt will be able to perform a sit to stand without UE assist so she increases independence with transfers.  Baseline:  Goal status: MET on 01/24/24  3.  Pt will reduce falls to <= 1 a week to demo improved safety and balance Baseline:  Goal status:  MET on week of 02/04/24 reporting no falls  (only 1 fall past week 01/31/24)    LONG TERM GOALS: Target date: 03/13/24  Pt will be efficient with advanced HEP so she increases independence with everyday activities.  Baseline:  Goal status: INITIAL  2.  Pt will improve Berg balance scale by 10 points so she is a decreased fall risk Baseline: 24/56 Goal status: INITIAL  3.  Pt will be able to complete a floor to standing transfer safely with min A.  Baseline: Unable  Goal status: INITIAL     PLAN:  PT FREQUENCY: 2x/week  PT DURATION: 8 weeks  PLANNED INTERVENTIONS: 97164- PT Re-evaluation, 97750- Physical Performance Testing, 97110-Therapeutic exercises, 97530- Therapeutic activity, 97112- Neuromuscular re-education, [phone]- Self Care, 02859- Manual therapy, [phone]- Gait training, [phone]- Aquatic Therapy, [phone]- Electrical stimulation (manual), Patient/Family education, Balance training, Stair training, Joint mobilization, Spinal manipulation, Visual/preceptual remediation/compensation, DME instructions, Wheelchair mobility training, Cryotherapy, and Moist heat  PLAN FOR NEXT SESSION: static and dynamic balance, gait, continue sit to stand practice, exercises requiring use of her left side, continue using tactile cue at pt's left ASIS for hip alignment during gait, continue floor to stand transfers and  sit to stands   [employer], Student-PT 02/04/2024, 12:49 PM

## 2024-02-05 ENCOUNTER — Emergency Department (HOSPITAL_COMMUNITY)

## 2024-02-05 ENCOUNTER — Other Ambulatory Visit: Payer: Self-pay

## 2024-02-05 ENCOUNTER — Emergency Department (HOSPITAL_COMMUNITY)
Admission: EM | Admit: 2024-02-05 | Discharge: 2024-02-05 | Disposition: A | Discharge status: Home / Self Care | Attending: Emergency Medicine | Admitting: Emergency Medicine

## 2024-02-05 DIAGNOSIS — W19XXXA Unspecified fall, initial encounter: Secondary | ICD-10-CM

## 2024-02-05 DIAGNOSIS — Y92002 Bathroom of unspecified non-institutional (private) residence single-family (private) house as the place of occurrence of the external cause: Secondary | ICD-10-CM | POA: Insufficient documentation

## 2024-02-05 DIAGNOSIS — K7460 Unspecified cirrhosis of liver: Secondary | ICD-10-CM | POA: Diagnosis not present

## 2024-02-05 DIAGNOSIS — I8510 Secondary esophageal varices without bleeding: Secondary | ICD-10-CM | POA: Diagnosis not present

## 2024-02-05 DIAGNOSIS — I2510 Atherosclerotic heart disease of native coronary artery without angina pectoris: Secondary | ICD-10-CM | POA: Diagnosis not present

## 2024-02-05 DIAGNOSIS — Z8673 Personal history of transient ischemic attack (TIA), and cerebral infarction without residual deficits: Secondary | ICD-10-CM | POA: Insufficient documentation

## 2024-02-05 DIAGNOSIS — I6782 Cerebral ischemia: Secondary | ICD-10-CM | POA: Diagnosis not present

## 2024-02-05 DIAGNOSIS — W01198A Fall on same level from slipping, tripping and stumbling with subsequent striking against other object, initial encounter: Secondary | ICD-10-CM | POA: Diagnosis not present

## 2024-02-05 DIAGNOSIS — J9811 Atelectasis: Secondary | ICD-10-CM | POA: Diagnosis not present

## 2024-02-05 DIAGNOSIS — R161 Splenomegaly, not elsewhere classified: Secondary | ICD-10-CM | POA: Diagnosis not present

## 2024-02-05 DIAGNOSIS — G9389 Other specified disorders of brain: Secondary | ICD-10-CM | POA: Insufficient documentation

## 2024-02-05 DIAGNOSIS — I69354 Hemiplegia and hemiparesis following cerebral infarction affecting left non-dominant side: Secondary | ICD-10-CM | POA: Insufficient documentation

## 2024-02-05 DIAGNOSIS — S0101XA Laceration without foreign body of scalp, initial encounter: Secondary | ICD-10-CM | POA: Insufficient documentation

## 2024-02-05 DIAGNOSIS — G8194 Hemiplegia, unspecified affecting left nondominant side: Secondary | ICD-10-CM | POA: Insufficient documentation

## 2024-02-05 LAB — CBC
HCT: 30.1 % — ABNORMAL LOW (ref 36.0–46.0)
HCT: 29 % — ABNORMAL LOW (ref 36.0–46.0)
Hemoglobin: 9.2 g/dL — ABNORMAL LOW (ref 12.0–15.0)
Hemoglobin: 9 g/dL — ABNORMAL LOW (ref 12.0–15.0)
MCH: 28.6 pg (ref 26.0–34.0)
MCH: 29 pg (ref 26.0–34.0)
MCHC: 30.6 g/dL (ref 30.0–36.0)
MCHC: 31 g/dL (ref 30.0–36.0)
MCV: 93.5 fL (ref 80.0–100.0)
MCV: 93.5 fL (ref 80.0–100.0)
Platelets: 66 K/uL — ABNORMAL LOW (ref 150–400)
Platelets: 68 K/uL — ABNORMAL LOW (ref 150–400)
RBC: 3.22 MIL/uL — ABNORMAL LOW (ref 3.87–5.11)
RBC: 3.1 MIL/uL — ABNORMAL LOW (ref 3.87–5.11)
RDW: 16.8 % — ABNORMAL HIGH (ref 11.5–15.5)
RDW: 16.7 % — ABNORMAL HIGH (ref 11.5–15.5)
WBC: 4.3 K/uL (ref 4.0–10.5)
WBC: 3.9 K/uL — ABNORMAL LOW (ref 4.0–10.5)
nRBC: 0 % (ref 0.0–0.2)
nRBC: 0 % (ref 0.0–0.2)

## 2024-02-05 LAB — COMPREHENSIVE METABOLIC PANEL WITH GFR
ALT: 22 U/L (ref 0–44)
AST: 28 U/L (ref 15–41)
Albumin: 3.4 g/dL — ABNORMAL LOW (ref 3.5–5.0)
Alkaline Phosphatase: 67 U/L (ref 38–126)
Anion gap: 11 (ref 5–15)
BUN: 19 mg/dL (ref 8–23)
CO2: 23 mmol/L (ref 22–32)
Calcium: 9.3 mg/dL (ref 8.9–10.3)
Chloride: 105 mmol/L (ref 98–111)
Creatinine, Ser: 0.83 mg/dL (ref 0.44–1.00)
GFR, Estimated: >60 mL/min (ref >60)
Glucose, Bld: 191 mg/dL — ABNORMAL HIGH (ref 70–99)
Potassium: 4.1 mmol/L (ref 3.5–5.1)
Sodium: 139 mmol/L (ref 135–145)
Total Bilirubin: 0.7 mg/dL (ref 0.0–1.2)
Total Protein: 5.6 g/dL — ABNORMAL LOW (ref 6.5–8.1)

## 2024-02-05 LAB — PROTIME-INR
INR: 1 (ref 0.8–1.2)
Prothrombin Time: 14.2 s (ref 11.4–15.2)

## 2024-02-05 LAB — I-STAT CHEM 8, ED
BUN: 22 mg/dL (ref 8–23)
Calcium, Ion: 1.14 mmol/L — ABNORMAL LOW (ref 1.15–1.40)
Chloride: 107 mmol/L (ref 98–111)
Creatinine, Ser: 0.9 mg/dL (ref 0.44–1.00)
Glucose, Bld: 180 mg/dL — ABNORMAL HIGH (ref 70–99)
HCT: 28 % — ABNORMAL LOW (ref 36.0–46.0)
Hemoglobin: 9.5 g/dL — ABNORMAL LOW (ref 12.0–15.0)
Potassium: 4.1 mmol/L (ref 3.5–5.1)
Sodium: 141 mmol/L (ref 135–145)
TCO2: 25 mmol/L (ref 22–32)

## 2024-02-05 LAB — ETHANOL: Alcohol, Ethyl (B): <15 mg/dL (ref <15)

## 2024-02-05 LAB — I-STAT CG4 LACTIC ACID, ED: Lactic Acid, Venous: 1.1 mmol/L (ref 0.5–1.9)

## 2024-02-05 LAB — SAMPLE TO BLOOD BANK

## 2024-02-05 MED ORDER — IOHEXOL 350 MG/ML SOLN
75.0000 mL | Freq: Once | INTRAVENOUS | Status: AC | PRN
  Administered 2024-02-05: 75 mL via INTRAVENOUS

## 2024-02-05 MED ORDER — BACITRACIN ZINC 500 UNIT/GM EX OINT: TOPICAL_OINTMENT | Freq: Two times a day (BID) | CUTANEOUS | Status: DC

## 2024-02-05 NOTE — Progress Notes (Signed)
 Chaplain responded to Level 1 Trauma, finding pt alert and aware of her surroundings.  Chaplain noted (as did nurses) how pleasant and kind the pt is.  Chaplain and pt conversed about how the world could use more kind people, wondering where the kindness had gone.  Pt suggested it might have to do with the onset of computers.   Pt spoke of her mother who had early-onset rheumatoid arthritis and the lessons she taught pt about being kind.  Pt. Also spoke of her grandmother who taught her to find something nice to say everyone she meets.  Pt commented on how good her daughter is to her.  When pt cautioned daughter it might be time to put her in an extended care facility because she's becoming more and dependent on her daughter, her daughter responded, Wilbert not concerned about that; we're focusing on doing what's best for you.  The positive effects of generational kindness is clearly evident.  Pt's daughter is on the way in.   [place] Chaplain

## 2024-02-05 NOTE — ED Provider Notes (Signed)
  [place] EMERGENCY DEPARTMENT AT [facility] Provider Assume Care Note I assumed care of Heather Luna on 02/05/2024 at 7 AM from Dr. Lorette.   Briefly, Heather Luna is a 73 y.o. female who: PMHx: Baseline left-sided deficits P/w ground-level fall while trying to get up the stairs Status post staple closure of 2 scalp lacerations, was initially loved one trauma for hypotension en route, normotensive here Workup including imaging and labs reassuring, needs repeat hemoglobin to ensure that she not had severe blood loss  Plan at the time of handoff: Repeat CBC   Please refer to the original provider's note for additional information regarding the care of Heather Luna.  Reassessment: I personally reassessed the patient: Patient without any acute complaints or additional questions.   Vital Signs:  ED Triage Vitals  Encounter Vitals Group     BP 02/05/24 0520 (!) 102/52     Girls Systolic BP Percentile --      Girls Diastolic BP Percentile --      Boys Systolic BP Percentile --      Boys Diastolic BP Percentile --      Pulse Rate 02/05/24 0530 77     Resp 02/05/24 0530 (!) 23     Temp 02/05/24 0525 97.9 F (36.6 C)     Temp Source 02/05/24 0525 Temporal     SpO2 02/05/24 0530 95 %     Weight 02/05/24 0529 200 lb (90.7 kg)     Height 02/05/24 0529 5' 3 (1.6 m)     Head Circumference --      Peak Flow --      Pain Score 02/05/24 0526 0     Pain Loc --      Pain Education --      Exclude from Growth Chart --      Hemodynamics:  The patient is hemodynamically stable. Mental Status:  The patient is alert  Additional MDM: On reevaluation, patient is hemodynamically stable, complains of no pain, well-appearing, lacerations hemostatic.  Repeat hemoglobin 9 from 9.2 previously, given this feel that patient is stable for discharge and outpatient follow-up.  Educated on wound care and return precautions.  No additional acute events while under my  care.  Disposition: DISCHARGE: I believe that the patient is safe for discharge home with outpatient follow-up. Patient was informed of all pertinent physical exam, laboratory, and imaging findings. Patient's suspected etiology of their symptom presentation was discussed with the patient and all questions were answered. We discussed following up with PCP. I provided thorough ED return precautions. The patient feels safe and comfortable with this plan.   Heather Luna Later, MD Emergency Medicine    Later Heather RAMAN, MD 02/05/24 [phone]

## 2024-02-05 NOTE — ED Notes (Signed)
 CCMD called and pt placed on the monitor.

## 2024-02-05 NOTE — Progress Notes (Signed)
 Orthopedic Tech Progress Note Patient Details:  Heather Luna 22-Apr-1950 [id number]  Patient ID: Heather Luna, female   DOB: 07-17-1950, 73 y.o.   MRN: [id number] Checked in for level 1 trauma.  Heather Luna 02/05/2024, 5:38 AM

## 2024-02-05 NOTE — Discharge Instructions (Signed)
 Heather Luna  Thank you for allowing us  to take care of you today.  You came to the Emergency Department today because you fell while trying to get up the stairs.  Here in the emergency department your imaging and labs are reassuring.  You did have 2 cuts on your scalp that we put staples and to close them.  Will need the staples taken in about 7 to 10 days.  He is to keep the wounds clean and dry for 24 hours, however after that you are safe to get them wet in the shower, however do not soak your head underwater until the staples have been removed.  We rechecked your blood counts, and your blood pressure was initially low with EMS, however your blood counts are stable, therefore you have not lost a severe amount of blood.  To-Do: 1. Please follow-up with your primary doctor within 1 - 2 weeks / as soon as possible.   Please return to the Emergency Department or call 911 if you experience have worsening of your symptoms, or do not get better, goal to moving to feeling part of your body that usually able to move or feel, chest pain, shortness of breath, severe or significantly worsening pain, high fever, severe confusion, pass out or have any reason to think that you need emergency medical care.   We hope you feel better soon.   Department of Emergency Medicine [facility]

## 2024-02-05 NOTE — ED Provider Notes (Signed)
 [place] EMERGENCY DEPARTMENT AT [facility] Provider Note   CSN: [id number] Arrival date & time: 02/05/24  9482     Patient presents with: [place]   Heather Luna is a 73 y.o. female.   73 year old female history of stroke and left-sided deficits presents ER today after a fall.  Patient states that she was was trying to transfer and fell backwards hitting her head on a wheelchair.  She had quite a bit of bleeding that showed multiple ABDs and her shirt.  EMS called.  Initially 120 systolic however not became 80s systolic so a level 1 trauma activated.  On arrival here patient has no complaints besides bleeding from her head.  She has no pain anywhere.   Fall       Prior to Admission medications   Not on File    Allergies: Patient has no allergy information on record.    Review of Systems  Updated Vital Signs BP 109/62   Pulse 77   Resp (!) 23   Ht 5' 3 (1.6 m)   Wt 90.7 kg   SpO2 95%   BMI 35.43 kg/m   Physical Exam Vitals and nursing note reviewed.  Constitutional:      Appearance: She is well-developed.  HENT:     Head: Normocephalic.     Comments: Two 1 cm lacerations to posterior scalp, hemostatic currently.  Cardiovascular:     Rate and Rhythm: Normal rate and regular rhythm.  Pulmonary:     Effort: No respiratory distress.     Breath sounds: No stridor.  Abdominal:     General: There is no distension.  Musculoskeletal:     Cervical back: Normal range of motion.  Neurological:     Mental Status: She is alert.     (all labs ordered are listed, but only abnormal results are displayed) Labs Reviewed  I-STAT CHEM 8, ED - Abnormal; Notable for the following components:      Result Value   Glucose, Bld 180 (*)    Calcium , Ion 1.14 (*)    Hemoglobin 9.5 (*)    HCT 28.0 (*)    All other components within normal limits  COMPREHENSIVE METABOLIC PANEL WITH GFR  CBC  ETHANOL  URINALYSIS, ROUTINE W REFLEX MICROSCOPIC  PROTIME-INR   I-STAT CG4 LACTIC ACID, ED  SAMPLE TO BLOOD BANK    EKG: None  Radiology: DG Chest Port 1 View Result Date: 02/05/2024 EXAM: 1 VIEW(S) XRAY OF THE CHEST 02/05/2024 06:12:34 AM COMPARISON: CT chest, abdomen, and pelvis today reported separately. CLINICAL HISTORY: Trauma FINDINGS: LUNGS AND PLEURA: Low lung volumes. No focal pulmonary opacity. No pleural effusion. No pneumothorax. HEART AND MEDIASTINUM: No acute abnormality of the cardiac and mediastinal silhouettes. BONES AND SOFT TISSUES: Left axillary surgical clips noted. No acute osseous abnormality. IMPRESSION: 1. Low lung volumes. No acute cardiopulmonary process. Electronically signed by: Helayne Hurst MD 02/05/2024 06:20 AM EST RP Workstation: [id number]   CT CHEST ABDOMEN PELVIS W CONTRAST Result Date: 02/05/2024 EXAM: CT CHEST ABDOMEN PELVIS WITH THORACIC AND LUMBAR SPINE RECONSTRUCTIONS 02/05/2024 05:49:06 AM TECHNIQUE: CT of the chest, abdomen, pelvis was performed after the administration of 75 mL of intravenous contrast (iohexol  (OMNIPAQUE ) 350 MG/ML injection 75 mL IOHEXOL  350 MG/ML SOLN). Multiplanar reformatted images are provided for review, including reconstructed images of the thoracic and lumbar spine. Automated exposure control, iterative reconstruction, and/or weight based adjustment of the mA/kV was utilized to reduce the radiation dose to as low as reasonably achievable.  COMPARISON: CT chest, abdomen, and pelvis 08/24/2023. CLINICAL HISTORY: 73 year old female. Polytrauma, blunt. Status post fall. FINDINGS: CT CHEST: THORACIC AORTA: Tortuous thoracic aorta with calcified more so than soft atherosclerotic plaque. No acute traumatic injury of the aorta. MEDIASTINUM: Heart size remains within normal limits. No pericardial effusion. Calcified coronary artery atherosclerosis on series 9 image 21. No mediastinal hematoma or pneumomediastinum. No acute traumatic injury to the heart or pericardium. The central airways are clear. LUNGS:  Stable lung volumes and dependent atelectasis in both lungs is unchanged since 08/24/2023. Major airways remain patent. Small left pleural effusion has resolved. No acute or suspicious lung opacity. No acute traumatic injury to the lungs. No pulmonary contusion or laceration. No pneumothorax. CHEST WALL: Chronic left mastectomy. Chronic posterior left lower rib fractures. Occasional chronic anterior left rib fractures (anterior left fourth rib). No acute rib fracture identified. No chest wall hematoma. CT ABDOMEN AND PELVIS: ABDOMINAL AORTA: Calcified aortoiliac atherosclerosis. Major arterial structures are patent. No acute traumatic injury of the aorta or iliac arteries. HEPATOBILIARY: Portal venous system appears patent, chronically dilated, with chronic liver varices including recanalized umbilical vein. Cirrhotic liver morphology. No discrete liver mass. No acute traumatic injury. SPLEEN: Stable lobulated splenomegaly. No acute traumatic injury. PANCREAS: No acute traumatic injury. ADRENAL GLANDS: No acute traumatic injury. KIDNEYS: Nonobstructed kidneys with symmetric renal enhancement and early contrast excretion on the delayed images. No acute traumatic injury. No hydronephrosis. GI TRACT: Redundant sigmoid colon with mild to moderate diverticulosis. Mild large bowel retained stool. Occasional transverse and descending colon diverticula. No large bowel inflammation identified. Diminutive or absent appendix. Distal paraesophageal varices are enhancing on series 9 image 40. Decompressed stomach. No acute traumatic injury of the bowel. No bowel obstruction. PERITONEUM: No ascites or free air. No pelvis free fluid. RETROPERITONEUM: No retroperitoneal hematoma. BLADDER: Diminutive urinary bladder. No acute abnormality. REPRODUCTIVE ORGANS: Uterus and ovaries surgically absent. No acute abnormality. BONES: Sacrum, SI joints, pelvis, and proximal femurs appear stable and intact. No acute traumatic fracture of the  pelvis. THORACIC AND LUMBAR SPINE: BONES AND ALIGNMENT: Normal thoracic and lumbar segmentation. Normal vertebral height. Chronic grade 1 anterolisthesis at L4-L5 appears degenerative, with associated lumbar facet arthropathy. No traumatic fracture or traumatic malalignment. DEGENERATIVE CHANGES: Lumbar facet arthropathy associated with chronic grade 1 anterolisthesis at L4-L5. No severe spinal canal stenosis or bony neural foraminal narrowing. SOFT TISSUES: No paraspinal mass or hematoma. IMPRESSION: 1. No acute traumatic injury identified in the chest, abdomen, pelvis, or thoracic/lumbar spine. 2. 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 3. Atelectasis. Chronic left rib fractures. Large bowel diverticulosis. Aortic and Coronary artery atherosclerosis. Electronically signed by: Helayne Hurst MD 02/05/2024 06:13 AM EST RP Workstation: [id number]   CT Cervical Spine Wo Contrast Result Date: 02/05/2024 EXAM: CT CERVICAL SPINE WITHOUT CONTRAST 02/05/2024 05:37:44 AM TECHNIQUE: CT of the cervical spine was performed without the administration of intravenous contrast. Multiplanar reformatted images are provided for review. Automated exposure control, iterative reconstruction, and/or weight based adjustment of the mA/kV was utilized to reduce the radiation dose to as low as reasonably achievable. COMPARISON: Head CT 02/05/2024 reported separately. Cervical spine CT 08/24/2023. CLINICAL HISTORY: 73 year old female status post fall which was unwitnessed. Neck trauma. FINDINGS: CERVICAL SPINE: BONES AND ALIGNMENT: Stable lordosis. No acute fracture or traumatic malalignment. DEGENERATIVE CHANGES: Extensive chronic bilateral cervical facet arthropathy. Advanced chronic disc and endplate degeneration at C6-C7. Advanced chronic anterior C1 odontoid degeneration. No significant cervical spinal stenosis by CT. SOFT TISSUES: Partially retropharyngeal  course of both  carotid arteries, normal variant. Mild perihilar cervical carotid calcified atherosclerosis. Chronic postinflammatory calcifications of the palatine tonsils. No prevertebral soft tissue swelling. Negative visible non-contrast thoracic inlet. IMPRESSION: 1. No acute traumatic injury identified in the cervical spine. 2. Chronic cervical spine degeneration. Electronically signed by: Helayne Hurst MD 02/05/2024 05:49 AM EST RP Workstation: [id number]   CT Head Wo Contrast Result Date: 02/05/2024 EXAM: CT HEAD WITHOUT CONTRAST 02/05/2024 05:37:44 AM TECHNIQUE: CT of the head was performed without the administration of intravenous contrast. Automated exposure control, iterative reconstruction, and/or weight based adjustment of the mA/kV was utilized to reduce the radiation dose to as low as reasonably achievable. COMPARISON: Head CT 08/24/2023. CLINICAL HISTORY: 73 year old female status post unwitnessed fall with minor head trauma. FINDINGS: BRAIN AND VENTRICLES: No acute hemorrhage. No evidence of acute infarct. No hydrocephalus. No extra-axial collection. No mass effect or midline shift. Multifocal chronic right hemisphere encephalomalacia, confluent throughout the right ACA territory with ex vacuo enlargement of the right lateral ventricle, stable from earlier this year. Confluent encephalomalacia also in the right basal ganglia. Superimposed multifocal small chronic appearing bilateral cerebellar infarcts. Better maintained gray white differentiation in the left cerebral hemisphere, although chronic small vessel disease changes in the left caudate, stable. Calcified atherosclerosis at the skull base. No suspicious intracranial vascular hyperdensity. ORBITS: No acute abnormality. SINUSES: Paranasal sinuses, middle ears and mastoids are clear. SOFT TISSUES AND SKULL: Posterior scalp convexity soft tissue swelling and stranding plus small foci of soft tissue gas (series 4 image 39) compatible with scalp laceration and  hematoma / contusion. Underlying calvarium appears stable and intact. Chronic hyperostosis frontalis, normal variant. IMPRESSION: 1. Scalp soft tissue injury. 2. No acute intracranial abnormality. Advanced chronic ischemic disease with pronounced right hemisphere encephalomalacia. Electronically signed by: Helayne Hurst MD 02/05/2024 05:46 AM EST RP Workstation: [id number]     .Critical Care  Performed by: Lorette Mayo, MD Authorized by: Lorette Mayo, MD   Critical care provider statement:    Critical care time (minutes):  30   Critical care was necessary to treat or prevent imminent or life-threatening deterioration of the following conditions:  Trauma   Critical care was time spent personally by me on the following activities:  Development of treatment plan with patient or surrogate, discussions with consultants, evaluation of patient's response to treatment, examination of patient, ordering and review of laboratory studies, ordering and review of radiographic studies, ordering and performing treatments and interventions, pulse oximetry, re-evaluation of patient's condition and review of old charts .Laceration Repair  Date/Time: 02/05/2024 6:24 AM  Performed by: Lorette Mayo, MD Authorized by: Lorette Mayo, MD   Consent:    Consent obtained:  Verbal   Consent given by:  Patient   Risks, benefits, and alternatives were discussed: yes     Risks discussed:  Infection, need for additional repair, nerve damage, poor wound healing, poor cosmetic result, pain and retained foreign body   Alternatives discussed:  No treatment, delayed treatment, observation and referral Universal protocol:    Procedure explained and questions answered to patient or proxy's satisfaction: yes     Immediately prior to procedure, a time out was called: yes     Patient identity confirmed:  Verbally with patient and arm band Laceration details:    Location:  Scalp   Scalp location:  Occipital   Length (cm):  1    Depth (mm):  5 Pre-procedure details:    Preparation:  Patient was prepped and draped in usual sterile fashion  Exploration:    Wound exploration: wound explored through full range of motion and entire depth of wound visualized   Treatment:    Area cleansed with:  Shur-Clens and soap and water   Amount of cleaning:  Standard   Irrigation solution:  Sterile water   Irrigation volume:  100   Irrigation method:  Syringe Skin repair:    Repair method:  Staples   Number of staples:  4 Approximation:    Approximation:  Close Repair type:    Repair type:  Simple Post-procedure details:    Dressing:  Antibiotic ointment   Procedure completion:  Tolerated well, no immediate complications .Laceration Repair  Date/Time: 02/05/2024 6:25 AM  Performed by: Lorette Mayo, MD Authorized by: Lorette Mayo, MD   Consent:    Consent obtained:  Verbal   Consent given by:  Patient   Risks, benefits, and alternatives were discussed: yes     Risks discussed:  Infection, need for additional repair, nerve damage, poor wound healing, poor cosmetic result, pain and retained foreign body   Alternatives discussed:  No treatment, delayed treatment, observation and referral Universal protocol:    Procedure explained and questions answered to patient or proxy's satisfaction: yes     Immediately prior to procedure, a time out was called: yes     Patient identity confirmed:  Verbally with patient and arm band Laceration details:    Location:  Scalp   Scalp location:  Crown   Length (cm):  1   Depth (mm):  4 Pre-procedure details:    Preparation:  Patient was prepped and draped in usual sterile fashion Exploration:    Wound exploration: wound explored through full range of motion and entire depth of wound visualized     Contaminated: no   Treatment:    Area cleansed with:  Shur-Clens and soap and water   Amount of cleaning:  Standard   Irrigation solution:  Sterile water   Irrigation volume:  100    Irrigation method:  Syringe Skin repair:    Repair method:  Staples   Number of staples:  4 Approximation:    Approximation:  Close Repair type:    Repair type:  Simple Post-procedure details:    Dressing:  Antibiotic ointment   Procedure completion:  Tolerated well, no immediate complications    Medications Ordered in the ED  iohexol  (OMNIPAQUE ) 350 MG/ML injection 75 mL (75 mLs Intravenous Contrast Given 02/05/24 0549)    Clinical Course as of 02/11/24 0234  Wed Feb 05, 2024  0621 Identified two lacerations on posterior scalp which are what I think caused most of the bleeding. NT to clean rest of blood and see if more. Pending ct cap.  [JM]  0651 Repeat Hb, head lacs sp repair, baseline L deficits, repeat Hb around 9, fall on stairs  [LS]    Clinical Course User Index [JM] Ski Polich, Mayo, MD [LS] Rogelia Jerilynn RAMAN, MD                                 Medical Decision Making Amount and/or Complexity of Data Reviewed Labs: ordered. Radiology: ordered.   Level 1 trauma secondary to hypotension.  Likely related to blood loss and may be some vagal response.  Patient with quite a bit of blood on her but no tenderness anywhere.  She had a baseline neurologic left-sided deficits.  She has good sensation on that side but has no  motor movement.  2 lacerations to the scalp repaired as above.  Hemoglobin 9 we will recheck prior to discharge.  Rest of workup is reassuring.  Patient overall appears well with normal vital signs.  No recurrent hypotension.  Will recheck hemoglobin.  Care transferred pending results of the hemoglobin level, reevaluation and ultimate disposition.   Dorothie Wah, Selinda, MD 02/11/24 [phone]

## 2024-02-05 NOTE — ED Notes (Signed)
 Pt placed in c-collar

## 2024-02-05 NOTE — ED Triage Notes (Addendum)
 Pt BIB [facility] from home. Pt had an unwitnessed fall this evening, states she got tripped up on her carpet in the bathroom. No LOC. On blood thinners and she did hit her head on the arm of the wheelchair. Upon EMS arrival there was blood on the floor and the pt soaked 4 ABD pads. Hx of strokes with L sided deficits.Initial pressure was 122/76 en route 80SBP A&Ox4. Laceration noted the back of the head and bruising to the left shoulder.  EMS  76P 98% RA 18R

## 2024-02-05 NOTE — Consult Note (Addendum)
 Reason for Consult:fall Referring Provider: Selinda Luna  Heather Luna is an 73 y.o. female.  HPI: 73 yo female was trying to transfer and fell hitting her head on the arm of her wheel chair. She does not complain of pain. She has baseline low blood pressure and her doctor tells her to drink water.  No past medical history on file.  No family history on file.  Social History:  has no history on file for tobacco use, alcohol use, and drug use.  Allergies: Not on File  Medications: I have reviewed the patient's current medications.  Results for orders placed or performed during the hospital encounter of 02/05/24 (from the past 48 hours)  Sample to Blood Bank     Status: None   Collection Time: 02/05/24  5:44 AM  Result Value Ref Range   Blood Bank Specimen SAMPLE AVAILABLE FOR TESTING    Sample Expiration      02/08/2024,2359 Performed at [facility], [address] [place], [place] [address]   I-Stat Lactic Acid, ED     Status: None   Collection Time: 02/05/24  5:45 AM  Result Value Ref Range   Lactic Acid, Venous 1.1 0.5 - 1.9 mmol/L  Protime-INR     Status: None   Collection Time: 02/05/24  5:45 AM  Result Value Ref Range   Prothrombin Time 14.2 11.4 - 15.2 seconds   INR 1.0 0.8 - 1.2    Comment: (NOTE) INR goal varies based on device and disease states. Performed at [facility], [address] [place], [place] [address]   I-Stat Chem 8, ED     Status: Abnormal   Collection Time: 02/05/24  5:46 AM  Result Value Ref Range   Sodium 141 135 - 145 mmol/L   Potassium 4.1 3.5 - 5.1 mmol/L   Chloride 107 98 - 111 mmol/L   BUN 22 8 - 23 mg/dL   Creatinine, Ser 9.09 0.44 - 1.00 mg/dL   Glucose, Bld 819 (H) 70 - 99 mg/dL    Comment: Glucose reference range applies only to samples taken after fasting for at least 8 hours.   Calcium , Ion 1.14 (L) 1.15 - 1.40 mmol/L   TCO2 25 22 - 32 mmol/L   Hemoglobin 9.5 (L) 12.0 - 15.0 g/dL   HCT 71.9 (L) 63.9 -  46.0 %    PE Blood pressure 109/62, pulse 77, resp. rate (!) 23, height 5' 3 (1.6 m), weight 90.7 kg, SpO2 95%. Constitutional: NAD; conversant; posterior scalp abrasion with dried blood throughout her hair Eyes: Moist conjunctiva; no lid lag; anicteric; PERRL Neck: Trachea midline; no thyromegaly Lungs: Normal respiratory effort; no tactile fremitus CV: RRR; no palpable thrills; no pitting edema GI: Abd nontender; no palpable hepatosplenomegaly MSK: unable to assess gait, left side paralysis; no clubbing/cyanosis Psychiatric: Appropriate affect; alert and oriented x3 Lymphatic: No palpable cervical or axillary lymphadenopathy Skin: No major subcutaneous nodules. Warm and dry   Assessment/Plan: 73 yo female who fell. She has a history of stroke with left sided paralysis and is on aggrenox . Scalp not actively bleeding abrasion seen posterior. CT HCCAP negative for acute findings. She has low blood pressure at baseline but had a worsened read en route in the 80s/50s. -ok to clear collar -no acute indication for trauma admission  I reviewed last 24 h vitals and pain scores, last 48 h intake and output, last 24 h labs and trends, and last 24 h imaging results.  This care required high  level of medical decision making.   Heather Luna Heather Luna 02/05/2024, 6:27 AM

## 2024-02-07 ENCOUNTER — Ambulatory Visit: Admitting: Physical Therapy

## 2024-02-18 ENCOUNTER — Ambulatory Visit: Admitting: Physical Therapy

## 2024-02-21 ENCOUNTER — Ambulatory Visit: Admitting: Physical Therapy

## 2024-03-03 ENCOUNTER — Ambulatory Visit: Admitting: Urgent Care

## 2024-03-03 VITALS — BP 102/66 | HR 82 | Ht 62.0 in | Wt 180.0 lb

## 2024-03-03 DIAGNOSIS — N1831 Chronic kidney disease, stage 3a: Secondary | ICD-10-CM

## 2024-03-03 DIAGNOSIS — D649 Anemia, unspecified: Secondary | ICD-10-CM

## 2024-03-03 DIAGNOSIS — E1149 Type 2 diabetes mellitus with other diabetic neurological complication: Secondary | ICD-10-CM

## 2024-03-03 DIAGNOSIS — K7460 Unspecified cirrhosis of liver: Secondary | ICD-10-CM

## 2024-03-03 DIAGNOSIS — R7989 Other specified abnormal findings of blood chemistry: Secondary | ICD-10-CM

## 2024-03-03 DIAGNOSIS — D696 Thrombocytopenia, unspecified: Secondary | ICD-10-CM

## 2024-03-03 DIAGNOSIS — R296 Repeated falls: Secondary | ICD-10-CM

## 2024-03-03 DIAGNOSIS — Z4802 Encounter for removal of sutures: Secondary | ICD-10-CM

## 2024-03-03 MED ORDER — AMBULATORY NON FORMULARY MEDICATION: 0 refills | Status: AC

## 2024-03-03 MED ORDER — AMBULATORY NON FORMULARY MEDICATION: 0 refills | Status: DC

## 2024-03-03 NOTE — Progress Notes (Signed)
 \  Established Patient Office Visit  Subjective:  Patient ID: Heather Luna, female    DOB: March 11, 1951  Age: 73 y.o. MRN: [id number]  Chief Complaint  Patient presents with   Follow-up    Falling approximately 5 days per week    HPI  Discussed the use of AI scribe software for clinical note transcription with the patient, who gave verbal consent to proceed.  History of Present Illness   Heather Luna is a 73 year old female with a history of falls and anemia who presents with frequent falls and dizziness.  She has been experiencing frequent falls, occurring three to five times a week, often requiring Life Alert assistance. These falls have increased in frequency since she fell three weeks ago, resulting in a head injury that required staples. Falls often occur at night when she is not wearing her brace, which she finds difficult to put on without assistance. She also has a history of wrist and shoulder fractures.  She experiences dizziness, which she has not previously disclosed, and her daughter notes that she sometimes imagines hearing the doorbell at night. She has a history of low hemoglobin levels, with a significant decline over the past seven months from 11.4 to 9.0. No recent urinary tract infection symptoms and she is drinking more water.  She has a history of cirrhosis, which affects her platelet levels, contributing to her bleeding risk. Additionally she is on lactulose . Previous discussion about colonoscopy was refused. She has not been using lactulose  regularly as she does not have issues with bowel movements, but her daughter manages her medications due to concerns about her memory and confusion.  She has not attended physical therapy recently due to her head injury but found it beneficial in the past. Her daughter is considering rearranging her bedroom to improve safety and accessibility, as falls often occur when she is moving between her bed and a bedside commode.       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) 01/02/2023   Hepatic cirrhosis (HCC) 01/02/2023   Recurrent falls 01/02/2023   History of seizure disorder 01/02/2023   Encephalomalacia with cerebral infarction (HCC) 01/02/2023   Recurrent UTI 09/24/2013   Absence of bladder continence 04/23/2012   Closed fracture of phalanx of foot 02/20/2012   Epilepsy (HCC) 07/03/2011   CVA (cerebral vascular accident) (HCC) 07/26/2005   Past Medical History:  Diagnosis Date   Anxiety    Arthritis    Cataract    Chronic UTI    Depression    Diabetes mellitus without complication (HCC)    Seizures (HCC)    Stroke (HCC)    Past Surgical History:  Procedure Laterality Date   ABDOMINAL HYSTERECTOMY     APPENDECTOMY     BREAST SURGERY     left breast   CHOLECYSTECTOMY     EYE SURGERY     left cataract   FRACTURE SURGERY     R foot   MASTECTOMY Left 2007   Social History[1]    ROS: as noted in HPI  Objective:     BP 102/66   Pulse 82   Ht 5' 2 (1.575 m)   Wt 180 lb (81.6 kg)   SpO2 98%  BMI 32.92 kg/m  BP Readings from Last 3 Encounters:  03/03/24 102/66  02/05/24 98/60  01/17/24 (!) 108/52   Wt Readings from Last 3 Encounters:  03/03/24 180 lb (81.6 kg)  02/05/24 200 lb (90.7 kg)  12/16/23 178 lb (80.7 kg)      Physical Exam Vitals and nursing note reviewed. Chaperone present: daughter.  Constitutional:      General: She is not in acute distress.    Appearance: Normal appearance. She is not ill-appearing, toxic-appearing or diaphoretic.  HENT:     Head: Normocephalic.     Comments: Well healed lac to crown of head, staples in place    Nose: Nose normal.     Mouth/Throat:      Mouth: Mucous membranes are moist.     Pharynx: Oropharynx is clear. No oropharyngeal exudate or posterior oropharyngeal erythema.  Eyes:     General: No scleral icterus.       Right eye: No discharge.        Left eye: No discharge.     Extraocular Movements: Extraocular movements intact.     Pupils: Pupils are equal, round, and reactive to light.  Neck:     Thyroid : No thyroid  mass, thyromegaly or thyroid  tenderness.  Cardiovascular:     Rate and Rhythm: Normal rate and regular rhythm.     Pulses: Normal pulses.  Pulmonary:     Effort: Pulmonary effort is normal. No respiratory distress.     Breath sounds: Normal breath sounds. No stridor. No wheezing or rhonchi.  Musculoskeletal:        General: Deformity (L arm/hand contracture) present.     Cervical back: Neck supple. No rigidity or tenderness.     Right lower leg: No edema.     Left lower leg: No edema.     Comments: Pt with chronic hemiplegia on L Pt in left leg brace Chronic contracture to L arm/ hand  Lymphadenopathy:     Cervical: No cervical adenopathy.  Skin:    General: Skin is warm and dry.     Findings: Bruising (L forearm) present. No erythema or rash.  Neurological:     General: No focal deficit present.     Mental Status: She is alert and oriented to person, place, and time.     Sensory: No sensory deficit.     Motor: Weakness (chronic L sided weakness.) present.     Gait: Gait abnormal (slow, shuffling gait. Abnormal foot placement on L secondary to bracing).  Psychiatric:        Mood and Affect: Mood normal.        Behavior: Behavior normal.    Suture Removal  Date/Time: 03/03/2024 11:30 AM  Performed by: Lowella Benton CROME, PA Authorized by: Lowella Benton CROME, PA  Body area: head/neck Location details: scalp Wound Appearance: clean Staples Removed: 4 Patient tolerance: patient tolerated the procedure well with no immediate complications      No results found for any visits on 03/03/24.  Last  CBC Lab Results  Component Value Date   WBC 3.9 (L) 02/05/2024   HGB 9.0 (L) 02/05/2024   HCT 29.0 (L) 02/05/2024   MCV 93.5 02/05/2024   MCH 29.0 02/05/2024   RDW 16.7 (H) 02/05/2024   PLT 68 (L) 02/05/2024   Last metabolic panel Lab Results  Component Value Date   GLUCOSE 180 (H) 02/05/2024   NA 141 02/05/2024   K 4.1 02/05/2024   CL 107 02/05/2024   CO2 23 02/05/2024  BUN 22 02/05/2024   CREATININE 0.90 02/05/2024   GFRNONAA >60 02/05/2024   CALCIUM  9.3 02/05/2024   PROT 5.6 (L) 02/05/2024   ALBUMIN 3.4 (L) 02/05/2024   LABGLOB 2.0 12/16/2023   BILITOT 0.7 02/05/2024   ALKPHOS 67 02/05/2024   AST 28 02/05/2024   ALT 22 02/05/2024   ANIONGAP 11 02/05/2024   Last lipids Lab Results  Component Value Date   CHOL 113 07/25/2023   HDL 46.60 07/25/2023   LDLCALC 49 07/25/2023   TRIG 86.0 07/25/2023   CHOLHDL 2 07/25/2023   Last hemoglobin A1c Lab Results  Component Value Date   HGBA1C 6.8 (H) 07/25/2023   Last thyroid  functions Lab Results  Component Value Date   TSH 0.78 07/25/2023   Last vitamin D  Lab Results  Component Value Date   VD25OH 38 07/11/2023   Last vitamin B12 and Folate Lab Results  Component Value Date   VITAMINB12 404 01/02/2023   FOLATE >24.2 01/02/2023      The ASCVD Risk score (Arnett DK, et al., 2019) failed to calculate for the following reasons:   Risk score cannot be calculated because patient has a medical history suggesting prior/existing ASCVD   * - Cholesterol units were assumed  Assessment & Plan:  Recurrent falls -     AMBULATORY NON FORMULARY MEDICATION; Rolling Knee Scooter, please take Rx to medical supply store.  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 Considered knee bike for stability. - Prescribed knee bike for stability and fall prevention. - Instructed to take knee bike to physical therapy for assessment and training. - Encouraged rearrangement of bedroom to improve safety and accessibility.  Anemia Chronic anemia with hemoglobin decreasing from 11.4 to 9.0, concern for gastrointestinal blood loss. Contributing to weakness, dizziness, and falls. - Ordered gastroenterology evaluation for colonoscopy to investigate gastrointestinal bleeding. - Checked iron levels to assess for iron deficiency anemia. - Rechecked hemoglobin levels.  Cirrhosis of liver Cirrhosis contributing to thrombocytopenia and potential anemia. No significant confusion or cognitive issues. - Continue monitoring liver function and platelet levels.  Thrombocytopenia Low platelet count likely secondary to cirrhosis, contributing to bleeding risk and anemia. - Continue monitoring platelet levels.   Type 2 DM On metformin . Due for A1C recheck. -A1C  Staple removal Completed in office. All staples removed, tolerated well.  Return in about 4 months (around 07/02/2024).   Benton LITTIE Gave, PA     [1]  Social History Tobacco Use   Smoking status: Never   Smokeless tobacco: Never  Vaping Use   Vaping status: Never Used  Substance Use Topics    Alcohol use: Never   Drug use: Never

## 2024-03-03 NOTE — Patient Instructions (Addendum)
 [facility] [address] [place], [place] [address] Phone: [phone]  Please call the address above for the knee bike. Take with you to your therapy appointment.  Please call GI and schedule a follow up for your anemia.   Follow up with me in 4 months.

## 2024-03-05 ENCOUNTER — Ambulatory Visit

## 2024-03-05 ENCOUNTER — Ambulatory Visit: Payer: Self-pay | Admitting: Urgent Care

## 2024-03-05 DIAGNOSIS — D509 Iron deficiency anemia, unspecified: Secondary | ICD-10-CM

## 2024-03-05 DIAGNOSIS — E1149 Type 2 diabetes mellitus with other diabetic neurological complication: Secondary | ICD-10-CM

## 2024-03-06 ENCOUNTER — Ambulatory Visit: Admitting: Urgent Care

## 2024-03-06 LAB — CBC WITH DIFFERENTIAL/PLATELET
Basophils Absolute: 0 x10E3/uL (ref 0.0–0.2)
Basos: 1 %
EOS (ABSOLUTE): 0.1 x10E3/uL (ref 0.0–0.4)
Eos: 2 %
Hematocrit: 30.6 % — ABNORMAL LOW (ref 34.0–46.6)
Hemoglobin: 9.2 g/dL — ABNORMAL LOW (ref 11.1–15.9)
Immature Grans (Abs): 0 x10E3/uL (ref 0.0–0.1)
Immature Granulocytes: 0 %
Lymphocytes Absolute: 0.8 x10E3/uL (ref 0.7–3.1)
Lymphs: 14 %
MCH: 27.6 pg (ref 26.6–33.0)
MCHC: 30.1 g/dL — ABNORMAL LOW (ref 31.5–35.7)
MCV: 92 fL (ref 79–97)
Monocytes Absolute: 0.6 x10E3/uL (ref 0.1–0.9)
Monocytes: 10 %
Neutrophils Absolute: 4 x10E3/uL (ref 1.4–7.0)
Neutrophils: 73 %
Platelets: 80 x10E3/uL — CL (ref 150–450)
RBC: 3.33 x10E6/uL — ABNORMAL LOW (ref 3.77–5.28)
RDW: 15.5 % — ABNORMAL HIGH (ref 11.7–15.4)
WBC: 5.5 x10E3/uL (ref 3.4–10.8)

## 2024-03-06 LAB — CMP14+EGFR
ALT: 23 IU/L (ref 0–32)
AST: 30 IU/L (ref 0–40)
Albumin: 4.1 g/dL (ref 3.8–4.8)
Alkaline Phosphatase: 108 IU/L (ref 49–135)
BUN/Creatinine Ratio: 20 (ref 12–28)
BUN: 16 mg/dL (ref 8–27)
Bilirubin Total: 0.6 mg/dL (ref 0.0–1.2)
CO2: 23 mmol/L (ref 20–29)
Calcium: 9.6 mg/dL (ref 8.7–10.3)
Chloride: 105 mmol/L (ref 96–106)
Creatinine, Ser: 0.8 mg/dL (ref 0.57–1.00)
Globulin, Total: 2.3 g/dL (ref 1.5–4.5)
Glucose: 179 mg/dL — ABNORMAL HIGH (ref 70–99)
Potassium: 4.5 mmol/L (ref 3.5–5.2)
Sodium: 142 mmol/L (ref 134–144)
Total Protein: 6.4 g/dL (ref 6.0–8.5)
eGFR: 78 mL/min/1.73 (ref >59)

## 2024-03-06 LAB — B12 AND FOLATE PANEL
Folate: >20 ng/mL (ref >3.0)
Vitamin B-12: 722 pg/mL (ref 232–1245)

## 2024-03-06 LAB — IRON,TIBC AND FERRITIN PANEL
Ferritin: 11 ng/mL — ABNORMAL LOW (ref 15–150)
Iron Saturation: 8 % — CL (ref 15–55)
Iron: 31 ug/dL (ref 27–139)
Total Iron Binding Capacity: 375 ug/dL (ref 250–450)
UIBC: 344 ug/dL (ref 118–369)

## 2024-03-06 LAB — HEMOGLOBIN A1C
Est. average glucose Bld gHb Est-mCnc: 174 mg/dL
Hgb A1c MFr Bld: 7.7 % — ABNORMAL HIGH (ref 4.8–5.6)

## 2024-03-06 LAB — AMMONIA: Ammonia: 53 ug/dL (ref 31–169)

## 2024-03-06 LAB — VITAMIN B1: Thiamine: 187.8 nmol/L (ref 66.5–200.0)

## 2024-03-06 LAB — VITAMIN B6: Vitamin B6: 11.5 ug/L (ref 3.4–65.2)

## 2024-03-14 ENCOUNTER — Other Ambulatory Visit: Payer: Self-pay | Admitting: Urgent Care

## 2024-03-14 DIAGNOSIS — Z8669 Personal history of other diseases of the nervous system and sense organs: Secondary | ICD-10-CM

## 2024-03-16 NOTE — Telephone Encounter (Signed)
 Left message for a return call from patient.   She should have run out of med a couple months ago if taken as prescribed. Please verify how she is taking.

## 2024-03-17 ENCOUNTER — Ambulatory Visit: Admitting: Podiatry

## 2024-03-17 ENCOUNTER — Other Ambulatory Visit: Payer: Self-pay

## 2024-03-17 DIAGNOSIS — Z8669 Personal history of other diseases of the nervous system and sense organs: Secondary | ICD-10-CM

## 2024-03-17 NOTE — Telephone Encounter (Signed)
 Attempted to return the call to patient.  Again left a voice mail message requesting  a return call.  Left my direct # for return call.

## 2024-03-17 NOTE — Telephone Encounter (Signed)
 Copied from CRM #[id number]. Topic: Clinical - Medication Question >> Mar 16, 2024  3:37 PM Shanda MATSU wrote: Reason for CRM: Patient's daughter returning Christal's call, Christal seems to have a question about med, lamoTRIgine  75 mg, I called [facility] to see if Christal was avail but was adv that she left early, adv caller of this info and that I will send message to clinic to return her call.

## 2024-03-18 MED ORDER — LAMOTRIGINE 25 MG PO TABS: 75.0000 mg | ORAL_TABLET | Freq: Two times a day (BID) | ORAL | 1 refills | Status: AC

## 2024-03-18 NOTE — Telephone Encounter (Signed)
 Spoke with patient daughter harlene  She states the possible reason for medication discrepancy was that her mother was not taking her medication correctly so she has stepped in in the last month and is making sure that the medications are now taken as directed.  She verified that she is taking Lamicatal as 3 tablets 2 times per day  and will now need refill ( she still has some medication remaining but is getting low)

## 2024-03-18 NOTE — Telephone Encounter (Signed)
 Forwarding message to Dr. Alvan covering Benton crain, PA And christal nichols who had send the message of inquiry.  Requesting rx rf of lamictral 25mg   Taking 3 tablets 2 times per day Last written 12/16/2023 Last OV 03/03/2024 Upcoming appt 07/03/2024

## 2024-03-18 NOTE — Telephone Encounter (Signed)
 Meds ordered this encounter  Medications   lamoTRIgine  (LAMICTAL ) 25 MG tablet    Sig: Take 3 tablets (75 mg total) by mouth 2 (two) times daily.    Dispense:  540 tablet    Refill:  1

## 2024-04-14 ENCOUNTER — Other Ambulatory Visit: Payer: Self-pay | Admitting: Urgent Care

## 2024-04-14 DIAGNOSIS — G9389 Other specified disorders of brain: Secondary | ICD-10-CM

## 2024-04-15 ENCOUNTER — Other Ambulatory Visit: Payer: Self-pay | Admitting: Urgent Care

## 2024-04-15 DIAGNOSIS — I639 Cerebral infarction, unspecified: Secondary | ICD-10-CM

## 2024-04-24 ENCOUNTER — Other Ambulatory Visit: Payer: Self-pay

## 2024-04-24 MED ORDER — ASPIRIN-DIPYRIDAMOLE ER 25-200 MG PO CP12: 1.0000 | ORAL_CAPSULE | Freq: Two times a day (BID) | ORAL | 1 refills | Status: AC

## 2024-05-15 ENCOUNTER — Ambulatory Visit: Admitting: Neurology

## 2024-07-03 ENCOUNTER — Ambulatory Visit: Admitting: Urgent Care
